# Patient Record
Sex: Female | Born: 1965
Health system: Southern US, Community
[De-identification: ages and names within clinical notes are randomized; demographics above are authoritative.]

## PROBLEM LIST (undated history)

## (undated) DIAGNOSIS — D689 Coagulation defect, unspecified: Secondary | ICD-10-CM

## (undated) HISTORY — PX: KNEE SURGERY: SHX244

## (undated) HISTORY — DX: Coagulation defect, unspecified: D68.9

## (undated) HISTORY — PX: TUBAL LIGATION: SHX77

## (undated) HISTORY — PX: CERVICAL CERCLAGE: SHX1329

---

## 1998-09-04 ENCOUNTER — Other Ambulatory Visit: Admission: RE | Admit: 1998-09-04 | Discharge: 1998-09-04 | Payer: Self-pay | Admitting: Obstetrics and Gynecology

## 1999-05-15 ENCOUNTER — Other Ambulatory Visit: Admission: RE | Admit: 1999-05-15 | Discharge: 1999-05-15 | Payer: Self-pay | Admitting: Obstetrics & Gynecology

## 1999-06-28 ENCOUNTER — Ambulatory Visit (HOSPITAL_COMMUNITY): Admission: RE | Admit: 1999-06-28 | Discharge: 1999-06-28 | Payer: Self-pay | Admitting: Obstetrics and Gynecology

## 1999-06-28 ENCOUNTER — Encounter: Payer: Self-pay | Admitting: Obstetrics and Gynecology

## 1999-07-03 ENCOUNTER — Ambulatory Visit (HOSPITAL_COMMUNITY): Admission: RE | Admit: 1999-07-03 | Discharge: 1999-07-03 | Payer: Self-pay | Admitting: Obstetrics and Gynecology

## 1999-08-09 ENCOUNTER — Ambulatory Visit (HOSPITAL_COMMUNITY): Admission: RE | Admit: 1999-08-09 | Discharge: 1999-08-09 | Payer: Self-pay | Admitting: Obstetrics and Gynecology

## 1999-08-09 ENCOUNTER — Encounter: Payer: Self-pay | Admitting: Obstetrics and Gynecology

## 1999-10-22 ENCOUNTER — Ambulatory Visit (HOSPITAL_COMMUNITY): Admission: RE | Admit: 1999-10-22 | Discharge: 1999-10-22 | Payer: Self-pay | Admitting: Obstetrics and Gynecology

## 1999-10-22 ENCOUNTER — Encounter: Payer: Self-pay | Admitting: Obstetrics and Gynecology

## 2000-01-04 ENCOUNTER — Inpatient Hospital Stay (HOSPITAL_COMMUNITY): Admission: AD | Admit: 2000-01-04 | Discharge: 2000-01-06 | Payer: Self-pay | Admitting: Obstetrics and Gynecology

## 2000-01-10 ENCOUNTER — Encounter: Admission: RE | Admit: 2000-01-10 | Discharge: 2000-04-09 | Payer: Self-pay | Admitting: Obstetrics and Gynecology

## 2002-03-26 ENCOUNTER — Other Ambulatory Visit: Admission: RE | Admit: 2002-03-26 | Discharge: 2002-03-26 | Payer: Self-pay | Admitting: Obstetrics and Gynecology

## 2003-06-22 ENCOUNTER — Other Ambulatory Visit: Admission: RE | Admit: 2003-06-22 | Discharge: 2003-06-22 | Payer: Self-pay | Admitting: Obstetrics and Gynecology

## 2003-06-27 ENCOUNTER — Ambulatory Visit (HOSPITAL_COMMUNITY): Admission: RE | Admit: 2003-06-27 | Discharge: 2003-06-27 | Payer: Self-pay | Admitting: Obstetrics and Gynecology

## 2003-11-03 ENCOUNTER — Inpatient Hospital Stay (HOSPITAL_COMMUNITY): Admission: AD | Admit: 2003-11-03 | Discharge: 2003-11-03 | Payer: Self-pay | Admitting: Obstetrics and Gynecology

## 2003-12-02 ENCOUNTER — Inpatient Hospital Stay (HOSPITAL_COMMUNITY): Admission: AD | Admit: 2003-12-02 | Discharge: 2003-12-02 | Payer: Self-pay | Admitting: Obstetrics and Gynecology

## 2003-12-16 ENCOUNTER — Inpatient Hospital Stay (HOSPITAL_COMMUNITY): Admission: AD | Admit: 2003-12-16 | Discharge: 2003-12-18 | Payer: Self-pay | Admitting: Obstetrics and Gynecology

## 2003-12-17 ENCOUNTER — Encounter (INDEPENDENT_AMBULATORY_CARE_PROVIDER_SITE_OTHER): Payer: Self-pay | Admitting: Specialist

## 2006-03-28 ENCOUNTER — Ambulatory Visit (HOSPITAL_COMMUNITY): Admission: RE | Admit: 2006-03-28 | Discharge: 2006-03-28 | Payer: Self-pay | Admitting: Obstetrics and Gynecology

## 2006-06-25 ENCOUNTER — Other Ambulatory Visit: Admission: RE | Admit: 2006-06-25 | Discharge: 2006-06-25 | Payer: Self-pay | Admitting: Gynecologic Oncology

## 2006-06-25 ENCOUNTER — Encounter (INDEPENDENT_AMBULATORY_CARE_PROVIDER_SITE_OTHER): Payer: Self-pay | Admitting: Specialist

## 2008-03-28 ENCOUNTER — Ambulatory Visit (HOSPITAL_COMMUNITY): Admission: RE | Admit: 2008-03-28 | Discharge: 2008-03-28 | Payer: Self-pay | Admitting: Obstetrics and Gynecology

## 2008-06-14 ENCOUNTER — Ambulatory Visit: Admission: RE | Admit: 2008-06-14 | Discharge: 2008-06-14 | Payer: Self-pay | Admitting: Gynecologic Oncology

## 2008-06-14 ENCOUNTER — Encounter: Payer: Self-pay | Admitting: Gynecologic Oncology

## 2009-08-03 ENCOUNTER — Ambulatory Visit: Payer: Self-pay | Admitting: Internal Medicine

## 2009-10-31 ENCOUNTER — Ambulatory Visit: Payer: Self-pay | Admitting: Internal Medicine

## 2010-01-19 ENCOUNTER — Ambulatory Visit (HOSPITAL_COMMUNITY): Admission: RE | Admit: 2010-01-19 | Discharge: 2010-01-19 | Payer: Self-pay | Admitting: Obstetrics and Gynecology

## 2010-12-25 NOTE — Consult Note (Signed)
NAME:  Lisa Gomez, Lisa Gomez          ACCOUNT NO.:  [id number]   MEDICAL RECORD NO.:  [id number]          PATIENT TYPE:  OUT   LOCATION:  GYN                          FACILITY:  [facility]   PHYSICIAN:  Paola A. Duard Brady, MD    DATE OF BIRTH:  Jan 11, 1966   DATE OF CONSULTATION:  06/14/2008  DATE OF DISCHARGE:                                 CONSULTATION   HISTORY:  The patient is a very pleasant 45 year old who comes in today  for a Pap smear.  She is overall doing quite well.  She has another  primary Lisa Gomez, however, due to scheduling issues wanted to have a Pap  smear done today.  She is otherwise without complaints.  She states she  does routine self breast exams and is up-to-date on her mammograms.   PHYSICAL EXAMINATION:  GENERAL:  Well-nourished, well-developed female  in no acute distress.  PELVIC:  External genitalia is within normal limits.  Vagina is well  epithelized.  The cervix is visualized.  It is multiparous in  appearance.  ThinPrep Pap was submitted without difficulty.  Bimanual  examination reveals no masses or nodularity.  The cervix is palpably  normal.  The corpus is of normal size, shape, consistency.  There are no  adnexal masses.   ASSESSMENT:  A 45 year old here for annual Pap smear.   PLAN:  We will follow up the results of her Pap smear and will notify  her of the reports.  If it is normal, she can return to her routine  Lisa Gomez for gynecologic care.      Paola A. Duard Brady, MD  Electronically Signed     PAG/MEDQ  D:  06/14/2008  T:  06/15/2008  Job:  [id number]   cc:   Hal Morales, M.D.  Fax: [phone]   Telford Nab, R.N.  [address]  [place], [place] [address]

## 2010-12-28 NOTE — Discharge Summary (Signed)
Lisa Gomez, Lisa Gomez                              ACCOUNT NO.:  [id number]   MEDICAL RECORD NO.:  [id number]                   PATIENT TYPE:  INP   LOCATION:  9137                                 [facility]ACILITY:  [facility]   PHYSICIAN:  Crist Fat. Rivard, M.D.              DATE OF BIRTH:  03-18-1966   DATE OF ADMISSION:  12/16/2003  DATE OF DISCHARGE:  12/18/2003                                 DISCHARGE SUMMARY   ADMITTING DIAGNOSES:  1. Intrauterine pregnancy at [redacted] weeks gestation.  2. Rupture of membranes.  3. Active labor.   PREOPERATIVE DIAGNOSES:  1. Intrauterine pregnancy at [redacted] weeks gestation.  2. Rupture of membranes.  3. Active labor.  4. Nonreassuring fetal heart rate tracing.  5. Desires sterilization.   PROCEDURE:  1. Vacuum-assisted vaginal delivery by Dr. Dierdre Forth.  2. Repair of second degree perineal laceration.  3. Postpartum bilateral tubal ligation and removal of left paratubal cyst by     Dr. Dois Davenport Rivard.   DISCHARGE DIAGNOSES:  1. Intrauterine pregnancy at [redacted] weeks gestation.  2. Rupture of membranes.  3. Active labor.  4. Nonreassuring fetal heart rate tracing.  5. Desires sterilization.  6. Vacuum-assisted vaginal delivery by Dr. Dierdre Forth.  7. Repair of second degree perineal laceration.  8. Postpartum bilateral tubal ligation and removal of left paratubal cyst by     Dr. Dois Davenport Rivard.   HISTORY:  [name] is a 45 year old, gravida 3, para 1-1-0-2, who presents  at [redacted] weeks gestation with premature rupture of membranes in active labor.   HOSPITAL COURSE:  Her labor progressed normally with variable decelerations  of fetal heart rate, and amnioinfusion was performed, and, as patient became  complete and began pushing, vacuum assistance was recommended and accepted  by patient for nonreassuring fetal heart rate tracing.  Therefore, vacuum-  assisted vaginal deliver was performed by Dr. Dierdre Forth with the birth  of a 6 pound, 12 ounce female  infant named Jerrel Ivory, with Apgar scores of 9  at 1 minute, 9 at 5 minutes.  Both patient and infant have done well in the  postpartum period.  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  On the first postpartum day, the patient underwent postpartum  bilateral tubal sterilization secondary to her desire for sterilization,  with removal of left paratubal cyst by Dr. Dois Davenport Rivard.  Her vital signs  have remained stable.  On the first postpartum day, her hemoglobin was noted  to be 8.0.  The patient has a history of anemia, and she has remained stable  with no dizziness or syncope.  The patient is stable with her abdomen soft,  fundus firm, 1 below u, with normal lochia.  Her umbilical incision is  clean, dry, and intact, and on this, her second postpartum day, she is  judged to be in satisfactory condition for discharge.  DISCHARGE INSTRUCTIONS:  Per [facility] handout.   DISCHARGE MEDICATIONS:  1. Motrin 600 mg p.o. q.6h. p.r.n. pain.  2. Tylox 1-2 p.o. q.3-4h. p.r.n. pain.  3. The patient will continue her prenatal vitamins and iron supplements.   FOLLOW UP:  She will call for any problems or concerns; otherwise, she will  be seen at the office at [facility] for her 6 weeks postpartum exam.     Rica Koyanagi, C.N.M.               Crist Fat Rivard, M.D.    SDM/MEDQ  D:  12/18/2003  T:  12/18/2003  Job:  [id number]

## 2010-12-28 NOTE — H&P (Signed)
NAME:  Lisa Gomez, Lisa Gomez                              ACCOUNT NO.:  [id number]   MEDICAL RECORD NO.:  [id number]                   PATIENT TYPE:  INP   LOCATION:  9166                                 [facility]ACILITY:  [facility]   PHYSICIAN:  Janine Limbo, M.D.            DATE OF BIRTH:  [birth year]   DATE OF ADMISSION:  12/16/2003  DATE OF DISCHARGE:                                HISTORY & PHYSICAL   HISTORY OF PRESENT ILLNESS:  Lisa Gomez is a 45 year old married black  female, gravida 3, para 1-1-0-2, at 83 weeks' gestation, who presents  complaining of leaking large amounts of clear fluid around 3:20 this  morning.  She reports mild uterine contractions but states that they are  infrequent, but also reports that she feel pressure and breathes with her  contractions when she has them.  She denies any nausea, vomiting, headaches  or visual disturbances.  Her pregnancy has been followed at [facility] by the M.D. service and has been at risk for:  #1 - Advanced maternal  age, declining amniocentesis; #2 - conception on intrauterine device; #3 - a  history of preterm delivery and incompetent cervix for which she had a  cerclage with this pregnancy.  Her group B strep is negative.  Her cerclage  was removed December 02, 2003.   OBSTETRICAL/GYNECOLOGICAL HISTORY:  She is a gravida 3, para 1-1-0-2, who  delivered a viable female infant in May of 1998 who weighed 3 pounds 14  ounces at 58 weeks' gestation following an 18-hour labor without  complication and in May of 2001, she delivered a viable female infant who  weighed 7 pounds 13 ounces at 40 weeks' gestation following a 7- to 8-hour  labor, also without complication, but she did have a cerclage with that  pregnancy.  She actually conceived this pregnancy on the IUD and had the IUD  removed in September 2004 and had a cerclage which was removed December 02, 2003.   ALLERGIES:  She has no known drug allergies.   GENERAL MEDICAL HISTORY:   She reports having had the usual childhood  diseases.  She reports a history of asthma as a child, occasional urinary  tract infections and her only surgeries were on the right knee 2 times and  the cerclage and some oral surgery.   FAMILY HISTORY:  Her family history is significant for paternal and maternal  grandfather with MI, both deceased, mom and dad with hypertension, on  medications, maternal aunt and paternal aunt with adult-onset diabetes,  father with thyroid disease, maternal grandmother and paternal grandmother  with stroke, uncle and another uncle with colon and lung cancer.   GENETIC HISTORY:  Her genetic history is only significant for the fact that  she is over age 64 at the time ___________.   SOCIAL HISTORY:  She is married to Dedra Skeens, who is involved and  supportive.  She is employed full-time as a Development worker, community in OB/GYN.  He is  employed in information systems __________.  They deny any religious  affiliation affecting their care.  They deny any illicit drug use, alcohol  or smoking with this pregnancy.   PRENATAL LABORATORY DATA:  Her blood type is B-positive, antibody screen is  negative, sickle cell trait is negative, syphilis is nonreactive, rubella is  positive, hepatitis B surface antigen is negative, GC and Chlamydia are  negative.  Her Pap is normal with the exception of some yeast and her 36-  week beta strep was negative.   PHYSICAL EXAMINATION:  VITAL SIGNS:  On physical exam, her vital signs are  stable.  She is afebrile.  HEENT:  Her HEENT is grossly within normal limits.  HEART:  Her heart is regular rate and rhythm.  CHEST:  Her chest is clear.  BREASTS:  Breasts are soft and nontender.  ABDOMEN:  Her abdomen is gravid with uterine contractions every 3-5 minutes.  Fetal heart rate is overall reactive and reassuring with some early  decelerations.  PELVIC:  Her cervix is 6 cm, 70%, vertex minus 1 and posterior with copious  clear fluid noted.   EXTREMITIES:  Her extremities are within normal limits.   ASSESSMENT:  1. Intrauterine pregnancy at term.  2. Spontaneous rupture of membranes with clear fluid at 3:20 a.m.  3. Active labor.  4. Negative group B streptococcus.   PLAN:  Her plan is to admit to labor and delivery, to follow routine M.D.  orders and Dr. Janine Limbo has been notified of the patient's  admission and will follow patient.     Concha Pyo. Duplantis, C.N.M.              Janine Limbo, M.D.    SJD/MEDQ  D:  12/16/2003  T:  12/16/2003  Job:  [id number]

## 2010-12-28 NOTE — Op Note (Signed)
   [name], Lisa Gomez                              ACCOUNT NO.:  [id number]   MEDICAL RECORD NO.:  [id number]                   PATIENT TYPE:  AMB   LOCATION:  SDC                                  [facility]   PHYSICIAN:  Hal Morales, M.D.             DATE OF BIRTH:  30-Aug-1965   DATE OF PROCEDURE:  06/27/2003  DATE OF DISCHARGE:                                 OPERATIVE REPORT   PREOPERATIVE DIAGNOSES:  1. Intrauterine pregnancy at [redacted] weeks gestation.  2. Incompetent cervix.   POSTOPERATIVE DIAGNOSES:  1. Intrauterine pregnancy at [redacted] weeks gestation.  2. Incompetent cervix.   OPERATION:  McDonald cerclage.   ANESTHESIA:  Spinal.   ESTIMATED BLOOD LOSS:  Less than 50 mL.   COMPLICATIONS:  None.   FINDINGS:  The cervix was long and closed but somewhat soft in consistency.  The uterus was approximately 14 weeks size.  The preoperative heart rate was  140 beats per minute.   PROCEDURE:  The patient was taken to the operating room after appropriate  identification and placed on the operating table.  After placement of a  spinal anesthetic, she was placed in the supine position and then in the  lithotomy position.  The perineum and vagina were prepped with multiple  layers at baseline and draped in a sterile field.  A red Robinson catheter  was used to empty the bladder.  A weighted speculum was placed in the  posterior vagina and sponge sticks placed on the anterior and posterior lips  of the cervix.  The internal os was identified, and a 5 mm Mersilene suture  used to create a pursestring at the level of the internal os, starting  posterior to the cervix and ending posterior to the cervix.  The pursestring  was then tied down and hemostasis noted to be adequate.  All instruments  were removed from the vagina, and the patient was taken from the operating  room to the recovery room in satisfactory condition, having tolerated the  procedure well with sponge and  instrument counts correct.                                               Hal Morales, M.D.    VPH/MEDQ  D:  06/27/2003  T:  06/27/2003  Job:  [id number]

## 2010-12-28 NOTE — H&P (Signed)
[facility]  Patient:    Lisa Gomez, Lisa Gomez                 MRN: [id number] Adm. Date:  [id number] Attending:  Shaune Spittle Dictator:   Vance Gather Duplantis, C.N.M.                         History and Physical  HISTORY OF PRESENT ILLNESS:       Ms. Lisa Gomez is a 45 year old, married black female, gravida 2, para 0-1-0-1, who is at [redacted] weeks gestation complaining of ruptured membranes at approximately 4 oclock this afternoon with clear fluid and the onset of uterine contractions every 10 to 15 minutes subsequently. She denies any nausea, vomiting, headache, or visual disturbances.  Her pregnancy has been followed at [facility] by the M.D. service and has been complicated by a history of preterm delivery at 31 weeks with her previous pregnancy and cerclage for cervical incompetence.  She had her cerclage removed on April 26 without complication.   OB/GYN HISTORY:                   She is a gravida 2, para 0-1-0-1, now at [redacted] weeks gestation who delivered a viable female infant in May of 1998 who weighed 3 pounds 14 ounces at [redacted] weeks gestation following a 24-hour labor period.  That pregnancy was complicated by preterm labor requiring magnesium and bed rest, and her delivery was effected after premature rupture of the membranes.  ALLERGIES:                        No known drug allergies.  PAST MEDICAL HISTORY:             She reports having had the usual childhood diseases.  She reports a history of anemia.  She reports occasional urinary tract infections and surgery on her right knee at age 44.  FAMILY HISTORY:                   Significant for both sets of grandparents with heart disease, mother with mitral valve prolapse, mother and father with chronic hypertension, father with varicose veins, maternal aunt with pulmonary embolism, paternal aunt with emphysema, maternal aunt and paternal aunt with non-insulin-dependent  diabetes mellitus, maternal grandmother and paternal grandmother with strokes.  GENETIC HISTORY:                  Negative.  SOCIAL HISTORY:                   She is married to [employer].  He is a Physicist, medical.  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  They deny illicit drug use, alcohol, or smoking with this pregnancy.  PRENATAL LABORATORY DATA:         Blood type is B positive.  Her antibody screen is negative, sickle cell trait is negative, syphilis nonreactive, toxoplasmosis negative, rubella immune, hepatitis B surface antigen negative, HIV nonreactive.  GC and chlamydia are both negative.   Pap was within normal limits.  One-hour glucola is within normal range.  Her 36-week beta Strep was negative.  PHYSICAL EXAMINATION:  VITAL SIGNS:                      Stable.  She is afebrile.  HEENT:  Grossly within normal limits.  HEART:                            Regular rhythm and rate.  CHEST:                            Clear.  BREASTS:                          Soft and nontender.  ABDOMEN:                          Gravid with uterine contractions that are mild and irregular.  Fetal heart rate is reassuring.  PELVIC:                           Exam per Dr. Dierdre Forth is 3 to 4 cm, 90% vertex, -2, and spontaneous rupture of membranes with clear fluid at approximately 4 p.m. today.  EXTREMITIES:                      Within normal limits.  ASSESSMENT:                       1. Intrauterine pregnancy at term.                                   2. Spontaneous rupture of membranes.                                   3. Early labor.                                   4. Negative group B Streptococcus.  PLAN:                             Admit to labor and delivery.  Dr. Dierdre Forth is aware of patients admission and will follow patient. DD:  01/04/00 TD:  01/04/00 Job: [id number]

## 2010-12-28 NOTE — Op Note (Signed)
Lisa Gomez, Lisa Gomez                              ACCOUNT NO.:  [id number]   MEDICAL RECORD NO.:  [id number]                   PATIENT TYPE:  INP   LOCATION:  9137                                 [facility]ACILITY:  [facility]   PHYSICIAN:  Crist Fat. Rivard, M.D.              DATE OF BIRTH:  Jan 04, 1966   DATE OF PROCEDURE:  12/17/2003  DATE OF DISCHARGE:  12/18/2003                                 OPERATIVE REPORT   PREOPERATIVE DIAGNOSIS:  Desire for sterilization.   POSTOPERATIVE DIAGNOSIS:  Desire for sterilization.   ANESTHESIA:  Epidural.   PROCEDURE:  Postpartum bilateral tubal ligation and removal of left  paratubal cyst.   SURGEON:  Dr. Estanislado Pandy.   ESTIMATED BLOOD LOSS:  Minimal.   PROCEDURE:  After being informed of the planned procedure and being aware of  the possible complications, informed consent is obtained.  Patient is taken  to OR #4, given epidural anesthesia with the preplaced epidural catheter.  She is placed in the dorsal decubitus position, prepped and draped in the  sterile fashion.  After assessing adequate level of anesthesia, we  infiltrate the umbilical area using 8 mL of Marcaine 0.25 and perform a semi-  elliptical incision which is brought down to the fascia.  The fascia is  identified, grasped with Allis forceps and incised with Mayo scissors.  The  peritoneum is then entered bluntly.  We are able to easily locate the left  tube which is grasped with Babcock forceps and exteriorized until fimbriae  is identified.  The mesosalpinx is then opened with cautery and we doubly  ligate each stump with 0 chromic, remove 1.5 cm of tube and cauterize each  stump with cautery.  Hemostasis is adequate.  We then perform removal of  three small paratubal cysts with cautery with no complication, hemostasis is  completed.  Please note, during the tying of the proximal stump patient  complained of pain which was well controlled with local irrigation with  Nesacaine 1%.  We then  proceed in the exact same manner on the right side,  each stump being doubly ligated with 0 chromic, a segment of 1.5 cm of tube  being removed, hemostasis being adequate, and we utilized the reminder of  Nesacaine 1% for postop pain which is locally released on the tube.  Fascia  is then closed with a running suture of 0 Vicryl, hemostasis of the fat is  completed with cautery and skin is closed with a subcuticular suture of 3-0  Vicryl and Steri-Strips.   Instruments and sponge count is complete x2.  Estimated blood loss is  minimal.  Procedure is well tolerated by patient who is taken to recovery  room in a well and stable condition.  Crist Fat Rivard, M.D.    SAR/MEDQ  D:  12/17/2003  T:  12/18/2003  Job:  [id number]

## 2010-12-28 NOTE — Op Note (Signed)
NAMEVALISHA, HESLIN                              ACCOUNT NO.:  [id number]   MEDICAL RECORD NO.:  [id number]                   PATIENT TYPE:  INP   LOCATION:  9137                                 [facility]ILITY[facility]   PHYSICIAN:  Hal Morales, M.D.             DATE OF BIRTH:  Oct 23, 1965   DATE OF PROCEDURE:  12/31/2003  DATE OF DISCHARGE:  12/18/2003                                 OPERATIVE REPORT   PREOPERATIVE DIAGNOSES:  1. Intrauterine pregnancy at 81 weeks' gestation.  2. Nonreassuring fetal heart rate tracing.   POSTOPERATIVE DIAGNOSES:  1. Intrauterine pregnancy at 61 weeks' gestation.  2. Nonreassuring fetal heart rate tracing.   OPERATION:  1. Vacuum-assisted vaginal delivery over intact perineum.  2. Repair of second degree perineal laceration.   ANESTHESIA:  Epidural.   ESTIMATED BLOOD LOSS:  Less than 500 mL.   COMPLICATIONS:  None.   FINDINGS:  The patient was delivered of a female infant whose name is  Jerrel Ivory, weighing 6 pounds 12 ounces, with Apgars of 9 and 9 at one and  five minutes, respectively.  There was an apparent left club foot.   PREOPERATIVE DISCUSSION:  The patient had dilated rapidly to 10 cm and with  pushing was noted to have deep variables to a nadir of 60 beats per minute.  This had been the case on several occasions during her labor.  This was  thought to be partially due to the rapidity of dilatation and descent.  The  fetal vertex was in an OA position at +3 station.  Because of the very deep  variable decelerations, a discussion was held with parents concerning the  option of vacuum-assisted vaginal delivery.  The benefit of more rapid  delivery than second-stage pushing was explained as well as the risks of  cephalohematoma, damage to maternal tissues, inability to effect a vaginal  delivery, and delivery of the fetal head with subsequent shoulder dystocia.  The parents opted to proceed with vacuum-assisted vaginal delivery.   The  patient already in the lithotomy position and the bladder had been  recently emptied.  The perineum had been prepped.  The Kiwi vacuum extractor  was then placed over the fetal vertex and with a single contraction, the  vacuum was used to deliver the fetal head over the intact perineum.  The  remainder of the infant was delivered with a combination of maternal  expulsive efforts and gentle traction.  The nares and pharynx were suctioned  and the cord was clamped and cut and the infant handed to the mother for  initial bonding.  The appropriate cord blood was drawn.  The placenta was  likewise delivered with a combination of maternal expulsive effort and  gentle traction.  A  second degree perineal laceration was then repaired with 3-0 Vicryl.  An ice  pack was placed on the perineum and the patient noted to  be stable.  The  infant was left with the mother for initial bonding with anticipated  anticipation to the full-term nursery.                                               Hal Morales, M.D.    VPH/MEDQ  D:  12/31/2003  T:  01/02/2004  Job:  [id number]

## 2012-07-02 ENCOUNTER — Other Ambulatory Visit: Payer: Self-pay | Admitting: Gynecologic Oncology

## 2012-07-02 DIAGNOSIS — Z1231 Encounter for screening mammogram for malignant neoplasm of breast: Secondary | ICD-10-CM

## 2012-07-28 ENCOUNTER — Ambulatory Visit (HOSPITAL_COMMUNITY)
Admission: RE | Admit: 2012-07-28 | Discharge: 2012-07-28 | Disposition: A | Discharge status: Home / Self Care | Payer: 59 | Source: Ambulatory Visit | Attending: Gynecologic Oncology | Admitting: Gynecologic Oncology

## 2012-07-28 DIAGNOSIS — Z1231 Encounter for screening mammogram for malignant neoplasm of breast: Secondary | ICD-10-CM | POA: Insufficient documentation

## 2013-01-05 ENCOUNTER — Other Ambulatory Visit: Payer: Self-pay | Admitting: Obstetrics

## 2013-03-16 ENCOUNTER — Other Ambulatory Visit: Payer: 59

## 2013-03-16 DIAGNOSIS — Z111 Encounter for screening for respiratory tuberculosis: Secondary | ICD-10-CM

## 2013-03-19 LAB — QUANTIFERON TB GOLD ASSAY (BLOOD)
Interferon Gamma Release Assay: NEGATIVE
Mitogen value: 9.08 IU/mL
Quantiferon Nil Value: 0.02 IU/mL
Quantiferon Tb Ag Minus Nil Value: 0 IU/mL
TB Ag value: 0.02 IU/mL

## 2013-04-30 ENCOUNTER — Other Ambulatory Visit: Payer: 59

## 2013-04-30 DIAGNOSIS — R739 Hyperglycemia, unspecified: Secondary | ICD-10-CM

## 2013-04-30 LAB — HEMOGLOBIN A1C
Hgb A1c MFr Bld: 6.1 % — ABNORMAL HIGH (ref <5.7)
Mean Plasma Glucose: 128 mg/dL — ABNORMAL HIGH (ref <117)

## 2013-05-01 LAB — CBC
HCT: 27.9 % — ABNORMAL LOW (ref 36.0–46.0)
Hemoglobin: 8.5 g/dL — ABNORMAL LOW (ref 12.0–15.0)
MCH: 21.7 pg — ABNORMAL LOW (ref 26.0–34.0)
MCHC: 30.5 g/dL (ref 30.0–36.0)
MCV: 71.2 fL — ABNORMAL LOW (ref 78.0–100.0)
Platelets: 428 10*3/uL — ABNORMAL HIGH (ref 150–400)
RBC: 3.92 MIL/uL (ref 3.87–5.11)
RDW: 19.2 % — ABNORMAL HIGH (ref 11.5–15.5)
WBC: 4.8 10*3/uL (ref 4.0–10.5)

## 2013-09-10 ENCOUNTER — Ambulatory Visit (INDEPENDENT_AMBULATORY_CARE_PROVIDER_SITE_OTHER): Payer: 59 | Admitting: Advanced Practice Midwife

## 2013-09-10 DIAGNOSIS — Z01419 Encounter for gynecological examination (general) (routine) without abnormal findings: Secondary | ICD-10-CM

## 2013-09-10 DIAGNOSIS — Z0000 Encounter for general adult medical examination without abnormal findings: Secondary | ICD-10-CM

## 2013-09-10 NOTE — Progress Notes (Signed)
Pap performed today.Tolerated well.  Nabothian Cyst x3 at 1, 3 and 6 o'clock.  Ming Kunka Wilson SingerWren CNM

## 2013-09-14 LAB — PAP IG AND HPV HIGH-RISK: HPV DNA High Risk: NOT DETECTED

## 2013-11-03 ENCOUNTER — Other Ambulatory Visit: Payer: Self-pay | Admitting: Obstetrics

## 2013-11-03 MED ORDER — DOXYCYCLINE MONOHYDRATE 25 MG/5ML PO SUSR
50.0000 mg | Freq: Two times a day (BID) | ORAL | Status: AC
Start: 2013-11-03 — End: 2013-11-13

## 2013-11-03 NOTE — Telephone Encounter (Signed)
Patient requesting rx for URI. Per Dr. Clearance CootsHarper okay to provide.

## 2013-11-05 ENCOUNTER — Other Ambulatory Visit: Payer: Self-pay | Admitting: *Deleted

## 2013-11-05 DIAGNOSIS — R251 Tremor, unspecified: Secondary | ICD-10-CM

## 2013-11-05 MED ORDER — PROPRANOLOL HCL 40 MG PO TABS: 40.0000 mg | ORAL_TABLET | Freq: Two times a day (BID) | ORAL | Status: DC

## 2013-11-10 ENCOUNTER — Other Ambulatory Visit: Payer: Self-pay | Admitting: Obstetrics and Gynecology

## 2013-11-10 DIAGNOSIS — Z1231 Encounter for screening mammogram for malignant neoplasm of breast: Secondary | ICD-10-CM

## 2013-11-15 ENCOUNTER — Ambulatory Visit (HOSPITAL_COMMUNITY)
Admission: RE | Admit: 2013-11-15 | Discharge: 2013-11-15 | Disposition: A | Discharge status: Home / Self Care | Payer: 59 | Source: Ambulatory Visit | Attending: Obstetrics and Gynecology | Admitting: Obstetrics and Gynecology

## 2013-11-15 DIAGNOSIS — Z1231 Encounter for screening mammogram for malignant neoplasm of breast: Secondary | ICD-10-CM | POA: Insufficient documentation

## 2014-03-31 ENCOUNTER — Other Ambulatory Visit: Payer: Self-pay | Admitting: *Deleted

## 2014-03-31 DIAGNOSIS — L700 Acne vulgaris: Secondary | ICD-10-CM

## 2014-03-31 MED ORDER — MINOCYCLINE HCL ER 55 MG PO TB24: 1.0000 | ORAL_TABLET | Freq: Every day | ORAL | Status: DC

## 2014-08-11 ENCOUNTER — Other Ambulatory Visit: Payer: Self-pay | Admitting: Obstetrics

## 2014-08-11 DIAGNOSIS — N390 Urinary tract infection, site not specified: Secondary | ICD-10-CM

## 2014-08-11 MED ORDER — NITROFURANTOIN MONOHYD MACRO 100 MG PO CAPS: 100.0000 mg | ORAL_CAPSULE | Freq: Two times a day (BID) | ORAL | Status: DC

## 2014-09-22 ENCOUNTER — Other Ambulatory Visit: Payer: Self-pay | Admitting: *Deleted

## 2014-09-22 DIAGNOSIS — Z20818 Contact with and (suspected) exposure to other bacterial communicable diseases: Secondary | ICD-10-CM

## 2014-09-22 MED ORDER — PENICILLIN V POTASSIUM 500 MG PO TABS: 500.0000 mg | ORAL_TABLET | Freq: Four times a day (QID) | ORAL | Status: DC

## 2014-12-27 ENCOUNTER — Other Ambulatory Visit: Payer: Self-pay | Admitting: *Deleted

## 2014-12-27 DIAGNOSIS — G4700 Insomnia, unspecified: Secondary | ICD-10-CM

## 2014-12-27 MED ORDER — HYDROXYZINE PAMOATE 50 MG PO CAPS: 50.0000 mg | ORAL_CAPSULE | Freq: Every evening | ORAL | Status: DC | PRN

## 2014-12-27 MED ORDER — PROPRANOLOL HCL 40 MG PO TABS: 40.0000 mg | ORAL_TABLET | Freq: Two times a day (BID) | ORAL | Status: AC

## 2015-07-05 ENCOUNTER — Other Ambulatory Visit: Payer: Self-pay

## 2015-07-05 DIAGNOSIS — Z1231 Encounter for screening mammogram for malignant neoplasm of breast: Secondary | ICD-10-CM

## 2015-07-24 ENCOUNTER — Ambulatory Visit
Admission: RE | Admit: 2015-07-24 | Discharge: 2015-07-24 | Disposition: A | Discharge status: Home / Self Care | Payer: BC Managed Care – PPO | Source: Ambulatory Visit

## 2015-07-24 DIAGNOSIS — Z1231 Encounter for screening mammogram for malignant neoplasm of breast: Secondary | ICD-10-CM

## 2016-08-07 ENCOUNTER — Other Ambulatory Visit: Payer: Self-pay | Admitting: Gynecologic Oncology

## 2016-08-07 ENCOUNTER — Encounter: Payer: Self-pay | Admitting: Gynecologic Oncology

## 2016-08-07 DIAGNOSIS — L0201 Cutaneous abscess of face: Secondary | ICD-10-CM

## 2016-08-07 MED ORDER — MINOCYCLINE HCL 50 MG PO TABS: 50.0000 mg | ORAL_TABLET | Freq: Two times a day (BID) | ORAL | 2 refills | Status: DC

## 2016-08-07 NOTE — Progress Notes (Signed)
Patient called reporting facial abscess from severe acne.  Has taken minocycline in the past and tolerated well.  New prescription sent in for twice daily for one month with two refills.  She is advised to call for any changes.  Reportable signs and symptoms reviewed.

## 2016-09-10 ENCOUNTER — Other Ambulatory Visit: Payer: Self-pay | Admitting: Family Medicine

## 2016-09-10 DIAGNOSIS — Z1231 Encounter for screening mammogram for malignant neoplasm of breast: Secondary | ICD-10-CM

## 2016-10-14 ENCOUNTER — Ambulatory Visit
Admission: RE | Admit: 2016-10-14 | Discharge: 2016-10-14 | Disposition: A | Discharge status: Home / Self Care | Payer: BC Managed Care – PPO | Source: Ambulatory Visit | Attending: Family Medicine | Admitting: Family Medicine

## 2016-10-14 DIAGNOSIS — Z1231 Encounter for screening mammogram for malignant neoplasm of breast: Secondary | ICD-10-CM

## 2017-09-22 ENCOUNTER — Other Ambulatory Visit: Payer: Self-pay | Admitting: Obstetrics and Gynecology

## 2017-09-22 DIAGNOSIS — Z1231 Encounter for screening mammogram for malignant neoplasm of breast: Secondary | ICD-10-CM

## 2017-11-10 ENCOUNTER — Ambulatory Visit
Admission: RE | Admit: 2017-11-10 | Discharge: 2017-11-10 | Disposition: A | Discharge status: Home / Self Care | Payer: BC Managed Care – PPO | Source: Ambulatory Visit | Attending: Obstetrics and Gynecology | Admitting: Obstetrics and Gynecology

## 2017-11-10 DIAGNOSIS — Z1231 Encounter for screening mammogram for malignant neoplasm of breast: Secondary | ICD-10-CM

## 2018-04-21 ENCOUNTER — Other Ambulatory Visit: Payer: Self-pay | Admitting: Gynecologic Oncology

## 2018-04-21 MED ORDER — CLINDAMYCIN PALMITATE HCL 75 MG/5ML PO SOLR: 150.0000 mg | Freq: Two times a day (BID) | ORAL | 0 refills | Status: DC

## 2018-04-21 NOTE — Progress Notes (Signed)
Pt presents to the office with gingivitis.  Difficulty with taking oral medications.  Liquid clindamycin sent to pharm.  She is advised to call for any needs or concerns.

## 2018-09-14 ENCOUNTER — Other Ambulatory Visit: Payer: Self-pay | Admitting: Gynecologic Oncology

## 2018-09-14 DIAGNOSIS — J111 Influenza due to unidentified influenza virus with other respiratory manifestations: Secondary | ICD-10-CM

## 2018-09-14 MED ORDER — OSELTAMIVIR PHOSPHATE 75 MG PO CAPS
75.0000 mg | ORAL_CAPSULE | Freq: Two times a day (BID) | ORAL | 0 refills | Status: DC
Start: 2018-09-14 — End: 2019-04-08

## 2018-09-14 NOTE — Progress Notes (Signed)
Patient presents with flu symptoms after being exposed multiple times to patient with positive influenza A testing.  Per ID, patient needs to begin Tamiflu immediately.

## 2018-10-26 ENCOUNTER — Other Ambulatory Visit: Payer: Self-pay | Admitting: Gynecologic Oncology

## 2018-10-26 DIAGNOSIS — R509 Fever, unspecified: Secondary | ICD-10-CM

## 2018-10-26 NOTE — Progress Notes (Signed)
Error

## 2018-10-26 NOTE — Progress Notes (Signed)
Patient has fever, dry cough, fatigue.  Exposed to sick patient (not confirmed COVID) on Friday.  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

## 2018-12-07 ENCOUNTER — Other Ambulatory Visit: Payer: Self-pay | Admitting: Gynecologic Oncology

## 2018-12-07 DIAGNOSIS — N3000 Acute cystitis without hematuria: Secondary | ICD-10-CM

## 2018-12-07 MED ORDER — SULFAMETHOXAZOLE-TRIMETHOPRIM 800-160 MG PO TABS: 1.0000 | ORAL_TABLET | Freq: Two times a day (BID) | ORAL | 0 refills | Status: DC

## 2018-12-07 MED FILL — SULFAMETHOXAZOLE-TMP DS TAB: 800-160 | 3 days supply | Qty: 6 | Fill #0

## 2018-12-07 NOTE — Progress Notes (Signed)
Patient called with complaints of dysuria, frequency, urgency for the past two days.  She has had urinary tract infections in the past and states this is similar to what she has experienced.  She has tolerated bactrim well in the past.  Plan to empirically treat. She is to call if symptoms persist.

## 2019-03-09 ENCOUNTER — Other Ambulatory Visit: Payer: Self-pay | Admitting: Gynecologic Oncology

## 2019-03-09 DIAGNOSIS — R600 Localized edema: Secondary | ICD-10-CM

## 2019-03-11 ENCOUNTER — Ambulatory Visit (HOSPITAL_COMMUNITY)
Admission: RE | Admit: 2019-03-11 | Discharge: 2019-03-11 | Disposition: A | Discharge status: Home / Self Care | Payer: BC Managed Care – PPO | Source: Ambulatory Visit | Attending: Gynecologic Oncology | Admitting: Gynecologic Oncology

## 2019-03-11 ENCOUNTER — Other Ambulatory Visit: Payer: Self-pay | Admitting: Gynecologic Oncology

## 2019-03-11 ENCOUNTER — Other Ambulatory Visit: Payer: Self-pay

## 2019-03-11 DIAGNOSIS — I82412 Acute embolism and thrombosis of left femoral vein: Secondary | ICD-10-CM

## 2019-03-11 DIAGNOSIS — R600 Localized edema: Secondary | ICD-10-CM | POA: Insufficient documentation

## 2019-03-11 LAB — CBC
HCT: 29.1 % — ABNORMAL LOW (ref 36.0–46.0)
Hemoglobin: 8.4 g/dL — ABNORMAL LOW (ref 12.0–15.0)
MCH: 23.5 pg — ABNORMAL LOW (ref 26.0–34.0)
MCHC: 28.9 g/dL — ABNORMAL LOW (ref 30.0–36.0)
MCV: 81.3 fL (ref 80.0–100.0)
Platelets: 457 10*3/uL — ABNORMAL HIGH (ref 150–400)
RBC: 3.58 MIL/uL — ABNORMAL LOW (ref 3.87–5.11)
RDW: 21.2 % — ABNORMAL HIGH (ref 11.5–15.5)
WBC: 4.6 10*3/uL (ref 4.0–10.5)
nRBC: 0 % (ref 0.0–0.2)

## 2019-03-11 LAB — COMPREHENSIVE METABOLIC PANEL
ALT: 16 U/L (ref 0–44)
AST: 17 U/L (ref 15–41)
Albumin: 4.4 g/dL (ref 3.5–5.0)
Alkaline Phosphatase: 46 U/L (ref 38–126)
Anion gap: 12 (ref 5–15)
BUN: 12 mg/dL (ref 6–20)
CO2: 23 mmol/L (ref 22–32)
Calcium: 8.9 mg/dL (ref 8.9–10.3)
Chloride: 103 mmol/L (ref 98–111)
Creatinine, Ser: 0.7 mg/dL (ref 0.44–1.00)
GFR calc Af Amer: >60 mL/min (ref >60)
GFR calc non Af Amer: >60 mL/min (ref >60)
Glucose, Bld: 97 mg/dL (ref 70–99)
Potassium: 3.8 mmol/L (ref 3.5–5.1)
Sodium: 138 mmol/L (ref 135–145)
Total Bilirubin: 0.9 mg/dL (ref 0.3–1.2)
Total Protein: 7.6 g/dL (ref 6.5–8.1)

## 2019-03-11 MED ORDER — RIVAROXABAN (XARELTO) VTE STARTER PACK (15 & 20 MG): ORAL_TABLET | ORAL | 0 refills | Status: DC

## 2019-03-11 MED FILL — XARELTO STARTER PACK: 15 & 20 | 30 days supply | Qty: 51 | Fill #0

## 2019-03-11 NOTE — Progress Notes (Signed)
Patient with newly diagnosed left DVT. No symptoms of PE. Will plan outpatient management with xarelto and referral to hematology.

## 2019-03-11 NOTE — Progress Notes (Addendum)
Left lower extremity venous duplex completed. Preliminary results pending  in Chart review [facility] [place], [place] 03/11/2019, 3:55 PM

## 2019-03-11 NOTE — Progress Notes (Signed)
Pre-treatment labs for DVT

## 2019-03-11 NOTE — Progress Notes (Signed)
Resent script for xarelto.

## 2019-04-06 ENCOUNTER — Telehealth: Payer: Self-pay

## 2019-04-06 NOTE — Telephone Encounter (Signed)
Called per Dr. Alvy Bimler and scheduled appt with for 1 pm on 8/27. She verbalized understanding.

## 2019-04-08 ENCOUNTER — Other Ambulatory Visit: Payer: Self-pay

## 2019-04-08 ENCOUNTER — Inpatient Hospital Stay: Payer: BC Managed Care – PPO | Attending: Hematology and Oncology | Admitting: Hematology and Oncology

## 2019-04-08 ENCOUNTER — Inpatient Hospital Stay: Payer: BC Managed Care – PPO

## 2019-04-08 ENCOUNTER — Encounter: Payer: Self-pay | Admitting: Hematology and Oncology

## 2019-04-08 DIAGNOSIS — I82402 Acute embolism and thrombosis of unspecified deep veins of left lower extremity: Secondary | ICD-10-CM | POA: Insufficient documentation

## 2019-04-08 DIAGNOSIS — D509 Iron deficiency anemia, unspecified: Secondary | ICD-10-CM | POA: Insufficient documentation

## 2019-04-08 DIAGNOSIS — I82492 Acute embolism and thrombosis of other specified deep vein of left lower extremity: Secondary | ICD-10-CM

## 2019-04-08 DIAGNOSIS — Z7901 Long term (current) use of anticoagulants: Secondary | ICD-10-CM

## 2019-04-08 DIAGNOSIS — D500 Iron deficiency anemia secondary to blood loss (chronic): Secondary | ICD-10-CM

## 2019-04-08 LAB — CBC WITH DIFFERENTIAL/PLATELET
Abs Immature Granulocytes: 0 10*3/uL (ref 0.00–0.07)
Basophils Absolute: 0 10*3/uL (ref 0.0–0.1)
Basophils Relative: 1 %
Eosinophils Absolute: 0.1 10*3/uL (ref 0.0–0.5)
Eosinophils Relative: 3 %
HCT: 31.5 % — ABNORMAL LOW (ref 36.0–46.0)
Hemoglobin: 9.4 g/dL — ABNORMAL LOW (ref 12.0–15.0)
Immature Granulocytes: 0 %
Lymphocytes Relative: 39 %
Lymphs Abs: 1.2 10*3/uL (ref 0.7–4.0)
MCH: 24.6 pg — ABNORMAL LOW (ref 26.0–34.0)
MCHC: 29.8 g/dL — ABNORMAL LOW (ref 30.0–36.0)
MCV: 82.5 fL (ref 80.0–100.0)
Monocytes Absolute: 0.5 10*3/uL (ref 0.1–1.0)
Monocytes Relative: 14 %
Neutro Abs: 1.3 10*3/uL — ABNORMAL LOW (ref 1.7–7.7)
Neutrophils Relative %: 43 %
Platelets: 503 10*3/uL — ABNORMAL HIGH (ref 150–400)
RBC: 3.82 MIL/uL — ABNORMAL LOW (ref 3.87–5.11)
RDW: 22.1 % — ABNORMAL HIGH (ref 11.5–15.5)
WBC: 3.1 10*3/uL — ABNORMAL LOW (ref 4.0–10.5)
nRBC: 0 % (ref 0.0–0.2)

## 2019-04-08 LAB — IRON AND TIBC
Iron: 12 ug/dL — ABNORMAL LOW (ref 41–142)
Saturation Ratios: 3 % — ABNORMAL LOW (ref 21–57)
TIBC: 451 ug/dL — ABNORMAL HIGH (ref 236–444)
UIBC: 439 ug/dL — ABNORMAL HIGH (ref 120–384)

## 2019-04-08 LAB — FERRITIN: Ferritin: 5 ng/mL — ABNORMAL LOW (ref 11–307)

## 2019-04-08 MED ORDER — RIVAROXABAN 20 MG PO TABS: 20.0000 mg | ORAL_TABLET | Freq: Every day | ORAL | 1 refills | Status: DC

## 2019-04-08 MED FILL — XARELTO 20 MG TABLET: 20 | 30 days supply | Qty: 30 | Fill #0

## 2019-04-08 NOTE — Assessment & Plan Note (Signed)
The most likely cause of her anemia is due to chronic blood loss/malabsorption syndrome. We discussed some of the risks, benefits, and alternatives of intravenous iron infusions. The patient is symptomatic from anemia and the iron level is critically low. She tolerated oral iron supplement poorly and desires to achieved higher levels of iron faster for adequate hematopoesis. Some of the side-effects to be expected including risks of infusion reactions, phlebitis, headaches, nausea and fatigue.  The patient is willing to proceed. Patient education material was dispensed.  Goal is to keep ferritin level greater than 50 and normalization of anemia I plan to recheck iron studies end of October

## 2019-04-08 NOTE — Progress Notes (Signed)
[facility] CONSULT NOTE  Patient Care Team: Helane Rima, MD as PCP - General (Family Medicine)  CHIEF COMPLAINTS/PURPOSE OF CONSULTATION:  Left lower extremity DVT, strong family history of blood clots  HISTORY OF PRESENTING ILLNESS:  Lisa Gomez 53 y.o. female is here because of recent diagnosis of LLE DVT This is her first diagnosis of blood clot  She noticed left lower extremity swelling for 1 week prior to diagnosis. She denies warmth, tenderness & erythema.  She denies recent chest pain on exertion, shortness of breath on minimal exertion, pre-syncopal episodes, hemoptysis, or palpitation. She denies recent history of trauma, long distance travel, dehydration, recent surgery, smoking but has reduced activity due to COVID restrictions. She had no prior history or diagnosis of cancer. Her age appropriate screening programs are up-to-date. She had prior surgeries before and never had perioperative thromboembolic events. The patient had been exposed to birth control pills, hormone replacement therapy and never had thrombotic events. The patient had been pregnant before and denies history of peripartum thromboembolic event or history of recurrent miscarriages. There is strong family history of blood clots   On 03/11/2019, she had venous Doppler US: Findings: Right: No evidence of common femoral vein obstruction. Left: Findings consistent with acute deep vein thrombosis involving the left posterior tibial veins.   She is also anemic and is noted to have thrombocytosis She has anemia all her life She has occasional dyspnea on exertion. Denies recent chest pain on exertion,pre-syncopal episodes, or palpitations. She had not noticed any recent bleeding such as epistaxis, hematuria or hematochezia The patient denies over the counter NSAID ingestion. She is not on antiplatelets agents. Her last colonoscopy was 3 years ago and was normal She has pica and eats a  variety of diet. She had donated blood but never received blood transfusion The patient was prescribed oral iron supplements but she tolerated that poorly   MEDICAL HISTORY:  Past Medical History:  Diagnosis Date  . Clotting disorder ([place])     SURGICAL HISTORY: Past Surgical History:  Procedure Laterality Date  . CERVICAL CERCLAGE    . KNEE SURGERY Left   . TUBAL LIGATION Bilateral     SOCIAL HISTORY: Social History   Socioeconomic History  . Marital status: Married    Spouse name: Not on file  . Number of children: 3  . Years of education: Not on file  . Highest education level: Not on file  Occupational History  . Occupation: GYN  Social Needs  . Financial resource strain: Not on file  . Food insecurity    Worry: Not on file    Inability: Not on file  . Transportation needs    Medical: Not on file    Non-medical: Not on file  Tobacco Use  . Smoking status: Never Smoker  . Smokeless tobacco: Never Used  Substance and Sexual Activity  . Alcohol use: Yes    Alcohol/week: 4.0 standard drinks    Types: 4 Glasses of wine per week  . Drug use: Never  . Sexual activity: Not on file  Lifestyle  . Physical activity    Days per week: Not on file    Minutes per session: Not on file  . Stress: Not on file  Relationships  . Social Herbalist on phone: Not on file    Gets together: Not on file    Attends religious service: Not on file    Active member of club or organization: Not on  file    Attends meetings of clubs or organizations: Not on file    Relationship status: Not on file  . Intimate partner violence    Fear of current or ex partner: Not on file    Emotionally abused: Not on file    Physically abused: Not on file    Forced sexual activity: Not on file  Other Topics Concern  . Not on file  Social History Narrative  . Not on file    FAMILY HISTORY: Family History  Problem Relation Age of Onset  . Clotting disorder Mother   . Cancer Father         multiple myeloma  . Clotting disorder Maternal Aunt        DVT  . Clotting disorder Maternal Uncle        DVT  . Clotting disorder Maternal Grandfather        DVT  . Breast cancer Neg Hx     ALLERGIES:  has no allergies on file.  MEDICATIONS:  Current Outpatient Medications  Medication Sig Dispense Refill  . hydrOXYzine (VISTARIL) 50 MG capsule Take 1 capsule (50 mg total) by mouth at bedtime as needed. 60 capsule 1  . propranolol (INDERAL) 40 MG tablet Take 1 tablet (40 mg total) by mouth 2 (two) times daily. 60 tablet 1  . rivaroxaban (XARELTO) 20 MG TABS tablet Take 1 tablet (20 mg total) by mouth daily with supper. 30 tablet 1   No current facility-administered medications for this visit.     REVIEW OF SYSTEMS:   Constitutional: Denies fevers, chills or abnormal night sweats Eyes: Denies blurriness of vision, double vision or watery eyes Ears, nose, mouth, throat, and face: Denies mucositis or sore throat Respiratory: Denies cough, dyspnea or wheezes Cardiovascular: Denies palpitation, chest discomfort  Gastrointestinal:  Denies nausea, heartburn or change in bowel habits Skin: Denies abnormal skin rashes Lymphatics: Denies new lymphadenopathy or easy bruising Neurological:Denies numbness, tingling or new weaknesses Behavioral/Psych: Mood is stable, no new changes  All other systems were reviewed with the patient and are negative.  PHYSICAL EXAMINATION: ECOG PERFORMANCE STATUS: 1 - Symptomatic but completely ambulatory  Vitals:   04/08/19 1306  BP: (!) 144/78  Pulse: 98  Resp: 20  Temp: 99.1 F (37.3 C)  SpO2: 100%   Filed Weights   04/08/19 1306  Weight: 138 lb (62.6 kg)    GENERAL:alert, no distress and comfortable HEART: she has minimal left lower extremity edema NEURO: no focal motor/sensory deficits  LABORATORY DATA:  I have reviewed the data as listed Lab Results  Component Value Date   WBC 4.6 03/11/2019   HGB 8.4 (L) 03/11/2019   HCT  29.1 (L) 03/11/2019   MCV 81.3 03/11/2019   PLT 457 (H) 03/11/2019    RADIOGRAPHIC STUDIES: I have personally reviewed the radiological images as listed and agreed with the findings in the report. Vas [place] Lower Extremity Venous (dvt)  Result Date: 03/12/2019  Lower Venous Study Indications: Swelling, and Pain.  Comparison Study: No prior study Performing Technologist: Toma Copier RVS  Examination Guidelines: A complete evaluation includes B-mode imaging, spectral Doppler, color Doppler, and power Doppler as needed of all accessible portions of each vessel. Bilateral testing is considered an integral part of a complete examination. Limited examinations for reoccurring indications may be performed as noted.  +-----+---------------+---------+-----------+----------+--------------+ RIGHTCompressibilityPhasicitySpontaneityPropertiesSummary        +-----+---------------+---------+-----------+----------+--------------+ CFV  Full           No  Yes                  pulsatile flow +-----+---------------+---------+-----------+----------+--------------+   +---------+---------------+---------+-----------+----------+-------+ LEFT     CompressibilityPhasicitySpontaneityPropertiesSummary +---------+---------------+---------+-----------+----------+-------+ CFV      Full           Yes      Yes                          +---------+---------------+---------+-----------+----------+-------+ SFJ      Full                                                 +---------+---------------+---------+-----------+----------+-------+ FV Prox  Full                                                 +---------+---------------+---------+-----------+----------+-------+ FV Mid   Full                                                 +---------+---------------+---------+-----------+----------+-------+ FV DistalFull                                                  +---------+---------------+---------+-----------+----------+-------+ PFV      Full                                                 +---------+---------------+---------+-----------+----------+-------+ POP      Full           Yes      Yes                          +---------+---------------+---------+-----------+----------+-------+ PTV      None                                         Acute   +---------+---------------+---------+-----------+----------+-------+     Summary: Right: No evidence of common femoral vein obstruction. Left: Findings consistent with acute deep vein thrombosis involving the left posterior tibial veins.  *See table(s) above for measurements and observations. Electronically signed by Curt Jews MD on 03/12/2019 at 1:53:33 PM.    Final     ASSESSMENT & PLAN:  #1 Left DVT/PE  This last episode of blood clot appeared to be provoked. We discussed about the pros and cons about testing for thrombophilia disorder. her current anticoagulation therapy will interfere with some the tests and it is not possible to interpret the test results.  Taking her off the anticoagulation therapy to do the tests may precipitate another thrombotic event. I do not see a reason to order excessive testing to screen for thrombophilia disorder as it would not change our management.  The goal of anticoagulation  therapy is for 3 months  We discussed about various options of anticoagulation therapies including warfarin, low molecular weight heparin such as Lovenox or newer agents such as Rivaroxaban. Some of the risks and benefits discussed including costs involved, the need for monitoring, risks of life-threatening bleeding/hospitalization, reversibility of each agent in the event of bleeding or overdose, safety profile of each drug and taking into account other social issues such as ease of administration of medications, etc. Ultimately, we have made an informed decision for the patient to continue  her treatment with Xarelto I refilled her prescription today We discussed approach to reduce risk of DVT in the future and she will consider taking aspirin after completion of Xarelto  Finally, at the end of our consultation today, I reinforced the importance of preventive strategies such as avoiding hormonal supplement, avoiding cigarette smoking, keeping up-to-date with screening programs for early cancer detection, frequent ambulation for long distance travel and aggressive DVT prophylaxis in all surgical settings.  I have not made a return appointment for the patient to come back. I would be happy to assist in perioperative DVT management in the future should she need any interruption of her anticoagulation therapy for elective procedures.  #2 Iron deficiency anemia The most likely cause of her anemia is due to chronic blood loss/malabsorption syndrome. We discussed some of the risks, benefits, and alternatives of intravenous iron infusions. The patient is symptomatic from anemia and the iron level is critically low. She tolerated oral iron supplement poorly and desires to achieved higher levels of iron faster for adequate hematopoesis. Some of the side-effects to be expected including risks of infusion reactions, phlebitis, headaches, nausea and fatigue.  The patient is willing to proceed. Patient education material was dispensed.  Goal is to keep ferritin level greater than 50 and resolution of anemia I plan to recheck in October after completion of IV iron   Orders Placed This Encounter  Procedures  . CBC with Differential/Platelet    Standing Status:   Standing    Number of Occurrences:   3    Standing Expiration Date:   04/07/2020  . Ferritin    Standing Status:   Standing    Number of Occurrences:   3    Standing Expiration Date:   04/07/2020  . Iron and TIBC    Standing Status:   Standing    Number of Occurrences:   3    Standing Expiration Date:   04/07/2020    All questions  were answered. The patient knows to call the clinic with any problems, questions or concerns. I spent 40 minutes counseling the patient face to face. The total time spent in the appointment was 45 minutes and more than 50% was on counseling.     Heath Lark, MD 04/08/2019 2:05 PM

## 2019-04-09 ENCOUNTER — Telehealth: Payer: Self-pay | Admitting: Hematology and Oncology

## 2019-04-09 NOTE — Telephone Encounter (Signed)
I left a message regarding schedule  

## 2019-04-15 ENCOUNTER — Other Ambulatory Visit: Payer: Self-pay | Admitting: Hematology and Oncology

## 2019-04-15 DIAGNOSIS — D539 Nutritional anemia, unspecified: Secondary | ICD-10-CM

## 2019-04-28 ENCOUNTER — Inpatient Hospital Stay: Payer: BC Managed Care – PPO | Attending: Hematology and Oncology

## 2019-04-28 ENCOUNTER — Other Ambulatory Visit: Payer: Self-pay

## 2019-04-28 VITALS — BP 142/94 | HR 61 | Temp 98.2°F | Resp 20

## 2019-04-28 DIAGNOSIS — D509 Iron deficiency anemia, unspecified: Secondary | ICD-10-CM | POA: Insufficient documentation

## 2019-04-28 DIAGNOSIS — Z23 Encounter for immunization: Secondary | ICD-10-CM | POA: Diagnosis not present

## 2019-04-28 DIAGNOSIS — D539 Nutritional anemia, unspecified: Secondary | ICD-10-CM

## 2019-04-28 DIAGNOSIS — I82492 Acute embolism and thrombosis of other specified deep vein of left lower extremity: Secondary | ICD-10-CM

## 2019-04-28 MED ORDER — INFLUENZA VAC SPLIT QUAD 0.5 ML IM SUSY
PREFILLED_SYRINGE | INTRAMUSCULAR | Status: AC
  Filled 2019-04-28: qty 0.5

## 2019-04-28 MED ORDER — SODIUM CHLORIDE 0.9 % IV SOLN
510.0000 mg | Freq: Once | INTRAVENOUS | Status: AC
  Administered 2019-04-28: 510 mg via INTRAVENOUS
  Filled 2019-04-28: qty 17

## 2019-04-28 MED ORDER — INFLUENZA VAC SPLIT QUAD 0.5 ML IM SUSY
0.5000 mL | PREFILLED_SYRINGE | Freq: Once | INTRAMUSCULAR | Status: AC
  Administered 2019-04-28: 0.5 mL via INTRAMUSCULAR

## 2019-04-28 MED ORDER — ACETAMINOPHEN 325 MG PO TABS
ORAL_TABLET | ORAL | Status: AC
  Filled 2019-04-28: qty 2

## 2019-04-28 MED ORDER — ACETAMINOPHEN 325 MG PO TABS
650.0000 mg | ORAL_TABLET | Freq: Once | ORAL | Status: AC
  Administered 2019-04-28: 650 mg via ORAL

## 2019-04-28 MED ORDER — SODIUM CHLORIDE 0.9 % IV SOLN
Freq: Once | INTRAVENOUS | Status: AC
  Administered 2019-04-28: 10:00:00 via INTRAVENOUS
  Filled 2019-04-28: qty 250

## 2019-04-28 NOTE — Patient Instructions (Signed)

## 2019-05-04 ENCOUNTER — Telehealth: Payer: Self-pay | Admitting: Hematology and Oncology

## 2019-05-04 NOTE — Telephone Encounter (Signed)
Returned patient's phone call regarding rescheduling 09/23 appointment, left a voicemail. °

## 2019-05-05 ENCOUNTER — Inpatient Hospital Stay: Payer: BC Managed Care – PPO

## 2019-05-07 ENCOUNTER — Other Ambulatory Visit: Payer: Self-pay | Admitting: Gynecologic Oncology

## 2019-05-07 DIAGNOSIS — G4700 Insomnia, unspecified: Secondary | ICD-10-CM

## 2019-05-07 MED ORDER — HYDROXYZINE PAMOATE 25 MG PO CAPS: 25.0000 mg | ORAL_CAPSULE | Freq: Every evening | ORAL | 3 refills | Status: DC | PRN

## 2019-05-07 MED FILL — HYDROXYZINE PAMOATE 25 MG C: 25 | 90 days supply | Qty: 90 | Fill #0

## 2019-05-07 NOTE — Progress Notes (Signed)
Refill sent in for vistaril for insomnia

## 2019-05-08 MED FILL — XARELTO 20 MG TABLET: 20 | 30 days supply | Qty: 30 | Fill #1

## 2019-05-13 ENCOUNTER — Other Ambulatory Visit: Payer: Self-pay | Admitting: Family Medicine

## 2019-05-13 DIAGNOSIS — Z1231 Encounter for screening mammogram for malignant neoplasm of breast: Secondary | ICD-10-CM

## 2019-05-20 ENCOUNTER — Ambulatory Visit
Admission: RE | Admit: 2019-05-20 | Discharge: 2019-05-20 | Disposition: A | Discharge status: Home / Self Care | Payer: BC Managed Care – PPO | Source: Ambulatory Visit | Attending: Family Medicine | Admitting: Family Medicine

## 2019-05-20 ENCOUNTER — Other Ambulatory Visit: Payer: Self-pay

## 2019-05-20 DIAGNOSIS — Z1231 Encounter for screening mammogram for malignant neoplasm of breast: Secondary | ICD-10-CM

## 2019-06-07 ENCOUNTER — Telehealth: Payer: Self-pay | Admitting: Hematology and Oncology

## 2019-06-07 NOTE — Telephone Encounter (Signed)
Returned patient's phone call regarding rescheduling an appointment, left a voicemail. 

## 2019-06-09 ENCOUNTER — Other Ambulatory Visit: Payer: BC Managed Care – PPO

## 2019-06-10 ENCOUNTER — Other Ambulatory Visit: Payer: Self-pay

## 2019-06-10 ENCOUNTER — Inpatient Hospital Stay: Payer: BC Managed Care – PPO | Attending: Hematology and Oncology

## 2019-06-10 DIAGNOSIS — D509 Iron deficiency anemia, unspecified: Secondary | ICD-10-CM | POA: Diagnosis present

## 2019-06-10 DIAGNOSIS — D539 Nutritional anemia, unspecified: Secondary | ICD-10-CM

## 2019-06-10 DIAGNOSIS — I82492 Acute embolism and thrombosis of other specified deep vein of left lower extremity: Secondary | ICD-10-CM

## 2019-06-10 DIAGNOSIS — D500 Iron deficiency anemia secondary to blood loss (chronic): Secondary | ICD-10-CM

## 2019-06-10 LAB — CBC WITH DIFFERENTIAL/PLATELET
Abs Immature Granulocytes: 0.01 10*3/uL (ref 0.00–0.07)
Basophils Absolute: 0 10*3/uL (ref 0.0–0.1)
Basophils Relative: 1 %
Eosinophils Absolute: 0.1 10*3/uL (ref 0.0–0.5)
Eosinophils Relative: 3 %
HCT: 40.9 % (ref 36.0–46.0)
Hemoglobin: 13.3 g/dL (ref 12.0–15.0)
Immature Granulocytes: 0 %
Lymphocytes Relative: 39 %
Lymphs Abs: 1.5 10*3/uL (ref 0.7–4.0)
MCH: 28.9 pg (ref 26.0–34.0)
MCHC: 32.5 g/dL (ref 30.0–36.0)
MCV: 88.9 fL (ref 80.0–100.0)
Monocytes Absolute: 0.3 10*3/uL (ref 0.1–1.0)
Monocytes Relative: 9 %
Neutro Abs: 1.8 10*3/uL (ref 1.7–7.7)
Neutrophils Relative %: 48 %
Platelets: 331 10*3/uL (ref 150–400)
RBC: 4.6 MIL/uL (ref 3.87–5.11)
RDW: 15.6 % — ABNORMAL HIGH (ref 11.5–15.5)
WBC: 3.7 10*3/uL — ABNORMAL LOW (ref 4.0–10.5)
nRBC: 0 % (ref 0.0–0.2)

## 2019-06-10 LAB — IRON AND TIBC
Iron: 61 ug/dL (ref 41–142)
Saturation Ratios: 18 % — ABNORMAL LOW (ref 21–57)
TIBC: 333 ug/dL (ref 236–444)
UIBC: 271 ug/dL (ref 120–384)

## 2019-06-10 LAB — VITAMIN B12: Vitamin B-12: 578 pg/mL (ref 180–914)

## 2019-06-10 LAB — FERRITIN: Ferritin: 27 ng/mL (ref 11–307)

## 2019-06-11 ENCOUNTER — Telehealth: Payer: Self-pay | Admitting: Hematology and Oncology

## 2019-06-11 NOTE — Telephone Encounter (Signed)
I reviewed her test results over the telephone Her anemia has resolved Thrombocytosis-resolved Iron studies are borderline adequate.  She will continue oral iron supplement every other day indefinitely I plan to recheck blood work again in 6 months She has completed about 3 months of anticoagulation therapy for DVT She should be able to stop Xarelto after current prescription has run out

## 2019-06-11 NOTE — Telephone Encounter (Signed)
Scheduled appt per 10/30 sch message - pt aware and reminder letter mailed.

## 2019-07-14 ENCOUNTER — Other Ambulatory Visit: Payer: Self-pay | Admitting: Gynecologic Oncology

## 2019-07-14 DIAGNOSIS — L709 Acne, unspecified: Secondary | ICD-10-CM

## 2019-07-14 MED ORDER — MINOCYCLINE HCL 100 MG PO TABS: 100.0000 mg | ORAL_TABLET | Freq: Every day | ORAL | 0 refills | Status: DC

## 2019-07-14 MED FILL — MINOCYCLINE HCL 100 MG CAPS: 100 | 90 days supply | Qty: 90 | Fill #0

## 2019-07-14 NOTE — Progress Notes (Signed)
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  Refill sent in for minocycline. She has taken in the past and tolerated well.

## 2019-09-08 MED FILL — BENAZEPRIL-HYDROCHLOROTHIAZ: 20-12.5 | 90 days supply | Qty: 90 | Fill #0

## 2019-10-12 MED FILL — HYDROXYZINE PAMOATE 25 MG C: 25 | 90 days supply | Qty: 90 | Fill #1

## 2019-12-08 ENCOUNTER — Inpatient Hospital Stay: Payer: BC Managed Care – PPO

## 2020-01-12 MED FILL — HYDROXYZINE PAMOATE 25 MG C: 25 | 90 days supply | Qty: 90 | Fill #2

## 2020-03-21 MED FILL — BENAZEPRIL-HYDROCHLOROTHIAZ: 20-12.5 | 90 days supply | Qty: 90 | Fill #1

## 2020-04-05 ENCOUNTER — Telehealth: Payer: Self-pay | Admitting: Gynecologic Oncology

## 2020-04-05 ENCOUNTER — Other Ambulatory Visit: Payer: Self-pay | Admitting: Gynecologic Oncology

## 2020-04-05 DIAGNOSIS — F419 Anxiety disorder, unspecified: Secondary | ICD-10-CM

## 2020-04-05 MED ORDER — ESCITALOPRAM OXALATE 10 MG PO TABS: 10.0000 mg | ORAL_TABLET | Freq: Every day | ORAL | 3 refills | Status: DC

## 2020-04-05 MED FILL — ESCITALOPRAM 10 MG TABLET: 10 | 90 days supply | Qty: 90 | Fill #0

## 2020-04-05 NOTE — Telephone Encounter (Signed)
Patient called the office asking for a new prescription for lexapro. She has taken this in the past and tolerated well. She has been experiencing generalized and situational anxiety.  No suicidal ideations reported. Since patient has taken in the past and tolerated well, new prescription sent in and patient to call for any new symptoms, changes to mood, any needs.

## 2020-05-17 ENCOUNTER — Other Ambulatory Visit: Payer: Self-pay | Admitting: Gynecologic Oncology

## 2020-05-17 DIAGNOSIS — G4700 Insomnia, unspecified: Secondary | ICD-10-CM

## 2020-05-18 ENCOUNTER — Other Ambulatory Visit: Payer: Self-pay | Admitting: Gynecologic Oncology

## 2020-05-18 DIAGNOSIS — N951 Menopausal and female climacteric states: Secondary | ICD-10-CM

## 2020-05-18 MED ORDER — ESTRADIOL 0.1 MG/GM VA CREA: 1.0000 | TOPICAL_CREAM | VAGINAL | 12 refills | Status: DC

## 2020-05-18 MED FILL — HYDROXYZINE PAMOATE 25 MG C: 25 | 90 days supply | Qty: 90 | Fill #0

## 2020-05-18 MED FILL — ESTRADIOL 0.1 MG/GM CREA: 0.1 | 90 days supply | Qty: 43 | Fill #0

## 2020-09-06 MED FILL — ESCITALOPRAM 10 MG TABLET: 10 | 90 days supply | Qty: 90 | Fill #1

## 2020-10-05 MED FILL — HYDROXYZINE PAMOATE 25 MG C: 25 | 90 days supply | Qty: 90 | Fill #1

## 2020-10-17 ENCOUNTER — Other Ambulatory Visit (HOSPITAL_COMMUNITY): Payer: Self-pay | Admitting: Internal Medicine

## 2020-10-17 MED FILL — BENAZEPRIL-HYDROCHLOROTHIAZ: 20-12.5 | 90 days supply | Qty: 90 | Fill #0

## 2020-12-21 ENCOUNTER — Other Ambulatory Visit (HOSPITAL_COMMUNITY): Payer: Self-pay

## 2020-12-21 ENCOUNTER — Other Ambulatory Visit: Payer: Self-pay | Admitting: Gynecologic Oncology

## 2020-12-21 DIAGNOSIS — T7029XA Other effects of high altitude, initial encounter: Secondary | ICD-10-CM

## 2020-12-21 DIAGNOSIS — D751 Secondary polycythemia: Secondary | ICD-10-CM

## 2020-12-21 MED ORDER — ACETAZOLAMIDE 125 MG PO TABS
125.0000 mg | ORAL_TABLET | Freq: Two times a day (BID) | ORAL | 0 refills | Status: AC
  Filled 2020-12-21 – 2021-01-02 (×2): qty 6, 3d supply, fill #0

## 2020-12-21 NOTE — Progress Notes (Signed)
Patient presents to the office without an appointment for medication to treat "acute mountain sickness" from being in high altitude environments. She has taken this medication before and has tolerated well.

## 2020-12-29 ENCOUNTER — Other Ambulatory Visit (HOSPITAL_COMMUNITY): Payer: Self-pay

## 2021-01-02 ENCOUNTER — Other Ambulatory Visit (HOSPITAL_COMMUNITY): Payer: Self-pay

## 2021-01-02 MED ORDER — CARESTART COVID-19 HOME TEST VI KIT
PACK | 0 refills | Status: AC
  Filled 2021-01-02: qty 4, 4d supply, fill #0

## 2021-01-04 ENCOUNTER — Other Ambulatory Visit: Payer: Self-pay | Admitting: Internal Medicine

## 2021-01-04 DIAGNOSIS — Z1231 Encounter for screening mammogram for malignant neoplasm of breast: Secondary | ICD-10-CM

## 2021-01-11 ENCOUNTER — Encounter: Payer: Self-pay | Admitting: Hematology and Oncology

## 2021-01-13 ENCOUNTER — Ambulatory Visit: Payer: BC Managed Care – PPO

## 2021-01-16 ENCOUNTER — Encounter: Payer: Self-pay | Admitting: Hematology and Oncology

## 2021-01-17 ENCOUNTER — Encounter: Payer: Self-pay | Admitting: Hematology and Oncology

## 2021-01-17 ENCOUNTER — Other Ambulatory Visit: Payer: Self-pay

## 2021-01-17 ENCOUNTER — Ambulatory Visit
Admission: RE | Admit: 2021-01-17 | Discharge: 2021-01-17 | Disposition: A | Discharge status: Home / Self Care | Payer: BC Managed Care – PPO | Source: Ambulatory Visit | Attending: Internal Medicine | Admitting: Internal Medicine

## 2021-01-17 DIAGNOSIS — Z1231 Encounter for screening mammogram for malignant neoplasm of breast: Secondary | ICD-10-CM

## 2021-03-07 ENCOUNTER — Ambulatory Visit: Payer: BC Managed Care – PPO

## 2021-04-12 ENCOUNTER — Other Ambulatory Visit (HOSPITAL_COMMUNITY): Payer: Self-pay

## 2021-04-12 ENCOUNTER — Other Ambulatory Visit: Payer: Self-pay | Admitting: Gynecologic Oncology

## 2021-04-12 DIAGNOSIS — F419 Anxiety disorder, unspecified: Secondary | ICD-10-CM

## 2021-04-12 MED ORDER — ESCITALOPRAM OXALATE 10 MG PO TABS
ORAL_TABLET | Freq: Every day | ORAL | 3 refills | Status: DC
  Filled 2021-04-12: qty 90, 90d supply, fill #0
  Filled 2021-10-03: qty 90, 90d supply, fill #1

## 2021-05-22 ENCOUNTER — Other Ambulatory Visit: Payer: Self-pay | Admitting: Gynecologic Oncology

## 2021-05-22 ENCOUNTER — Other Ambulatory Visit (HOSPITAL_COMMUNITY): Payer: Self-pay

## 2021-05-22 DIAGNOSIS — N951 Menopausal and female climacteric states: Secondary | ICD-10-CM

## 2021-05-22 MED ORDER — ESTRADIOL 0.1 MG/GM VA CREA
1.0000 | TOPICAL_CREAM | VAGINAL | 12 refills | Status: DC
  Filled 2021-05-22: qty 42.5, 30d supply, fill #0
  Filled 2021-06-07: qty 42.5, 25d supply, fill #0

## 2021-05-22 NOTE — Progress Notes (Signed)
Patient called requesting refill on estrace vaginal cream for menopausal vaginal dryness.

## 2021-05-30 ENCOUNTER — Other Ambulatory Visit (HOSPITAL_COMMUNITY): Payer: Self-pay

## 2021-06-07 ENCOUNTER — Other Ambulatory Visit (HOSPITAL_COMMUNITY): Payer: Self-pay

## 2021-08-02 ENCOUNTER — Other Ambulatory Visit: Payer: Self-pay | Admitting: Gynecologic Oncology

## 2021-08-02 ENCOUNTER — Other Ambulatory Visit (HOSPITAL_COMMUNITY): Payer: Self-pay

## 2021-08-02 DIAGNOSIS — I159 Secondary hypertension, unspecified: Secondary | ICD-10-CM

## 2021-08-02 MED ORDER — LISINOPRIL-HYDROCHLOROTHIAZIDE 10-12.5 MG PO TABS
1.0000 | ORAL_TABLET | Freq: Every day | ORAL | 3 refills | Status: DC
  Filled 2021-08-02: qty 90, 90d supply, fill #0

## 2021-08-02 NOTE — Progress Notes (Signed)
Patient called and requested refill on HTN medications. She checks her BP on a regular basis.

## 2021-10-03 ENCOUNTER — Other Ambulatory Visit (HOSPITAL_COMMUNITY): Payer: Self-pay

## 2021-11-07 ENCOUNTER — Other Ambulatory Visit (HOSPITAL_COMMUNITY): Payer: Self-pay

## 2021-11-07 ENCOUNTER — Other Ambulatory Visit: Payer: Self-pay | Admitting: Gynecologic Oncology

## 2021-11-07 DIAGNOSIS — G8918 Other acute postprocedural pain: Secondary | ICD-10-CM

## 2021-11-07 MED ORDER — DIAZEPAM 5 MG PO TABS
5.0000 mg | ORAL_TABLET | Freq: Once | ORAL | 0 refills | Status: AC
  Filled 2021-11-07: qty 1, 1d supply, fill #0

## 2021-11-07 NOTE — Progress Notes (Signed)
Patient called and is having pain and anxiety related to pelvic examination. Plan for one time dose prior to pelvic examination. Instructed on use. Advised to not take and drive. Aware of instructions. ?

## 2021-11-30 ENCOUNTER — Other Ambulatory Visit (HOSPITAL_COMMUNITY): Payer: Self-pay

## 2021-11-30 MED ORDER — ESCITALOPRAM OXALATE 10 MG PO TABS
ORAL_TABLET | ORAL | 3 refills | Status: DC
  Filled 2021-11-30: qty 90, 90d supply, fill #0

## 2021-11-30 MED ORDER — PROPRANOLOL HCL 40 MG PO TABS
ORAL_TABLET | ORAL | 3 refills | Status: AC
  Filled 2021-11-30: qty 90, 45d supply, fill #0

## 2021-12-10 ENCOUNTER — Other Ambulatory Visit (HOSPITAL_COMMUNITY): Payer: Self-pay

## 2022-01-02 ENCOUNTER — Other Ambulatory Visit: Payer: Self-pay | Admitting: Gynecologic Oncology

## 2022-01-02 ENCOUNTER — Other Ambulatory Visit (HOSPITAL_COMMUNITY): Payer: Self-pay

## 2022-01-02 DIAGNOSIS — G4700 Insomnia, unspecified: Secondary | ICD-10-CM

## 2022-01-03 ENCOUNTER — Other Ambulatory Visit (HOSPITAL_COMMUNITY): Payer: Self-pay

## 2022-01-03 MED ORDER — HYDROXYZINE PAMOATE 25 MG PO CAPS
ORAL_CAPSULE | ORAL | 3 refills | Status: AC
  Filled 2022-01-03: qty 90, 90d supply, fill #0

## 2022-01-11 ENCOUNTER — Other Ambulatory Visit (HOSPITAL_COMMUNITY): Payer: Self-pay

## 2022-01-29 IMAGING — MG MM DIGITAL SCREENING BILAT W/ TOMO AND CAD
8 series · 8 of 24 positions shown · non-contrast
Comparison: Previous exam(s).

CLINICAL DATA: Screening.

EXAM:
DIGITAL SCREENING BILATERAL MAMMOGRAM WITH TOMOSYNTHESIS AND CAD
TECHNIQUE: Bilateral screening digital craniocaudal and mediolateral oblique
mammograms were obtained. Bilateral screening digital breast
tomosynthesis was performed. The images were evaluated with
computer-aided detection.

[L MLO synth-2D]
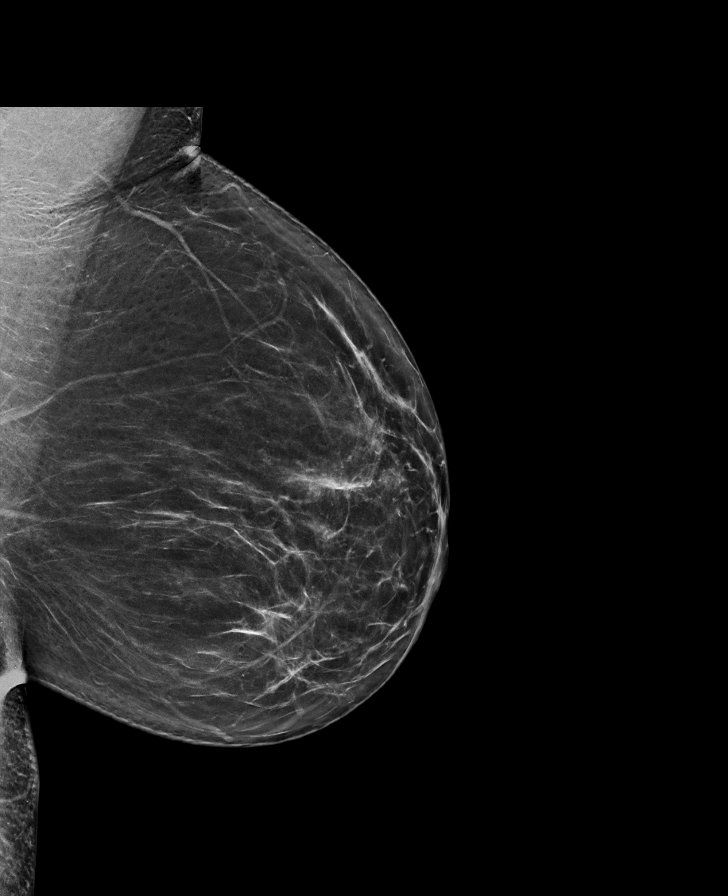

[R CC synth-2D]
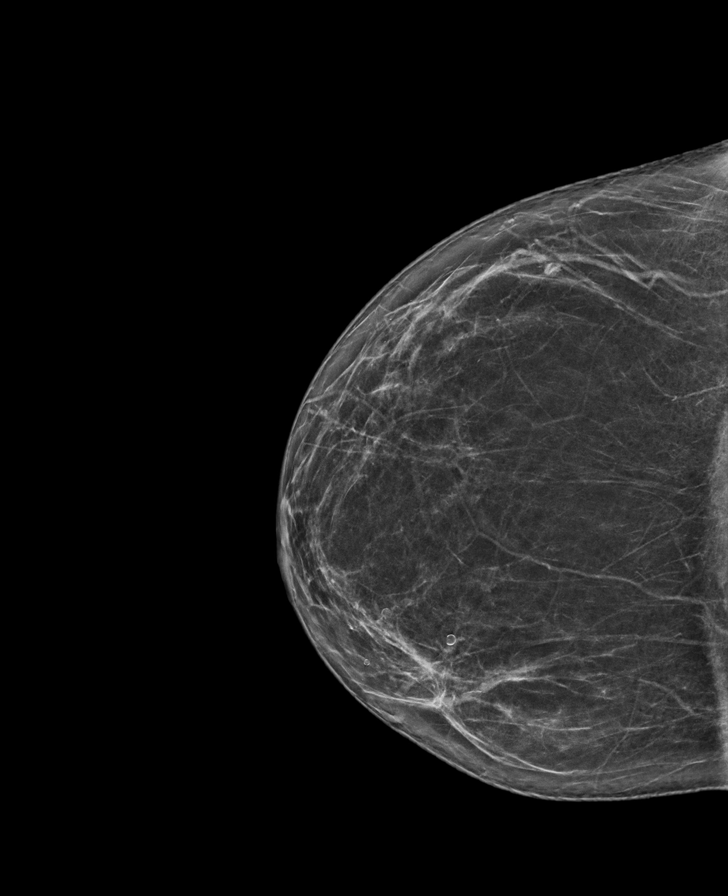

[R MLO synth-2D]
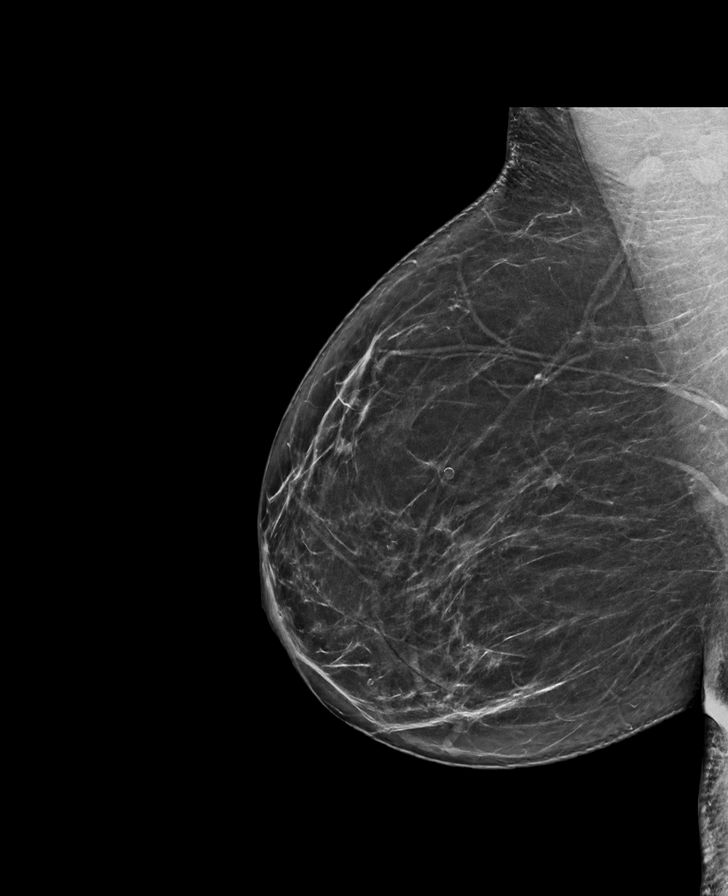

[L CC synth-2D]
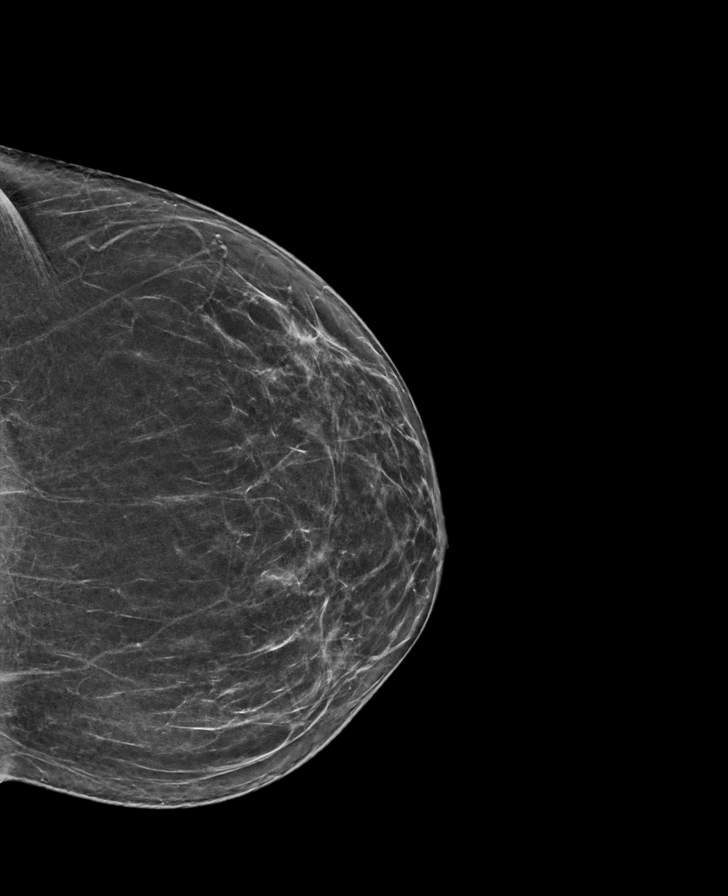

[L MLO tomo · tomo slice 40/79.0]
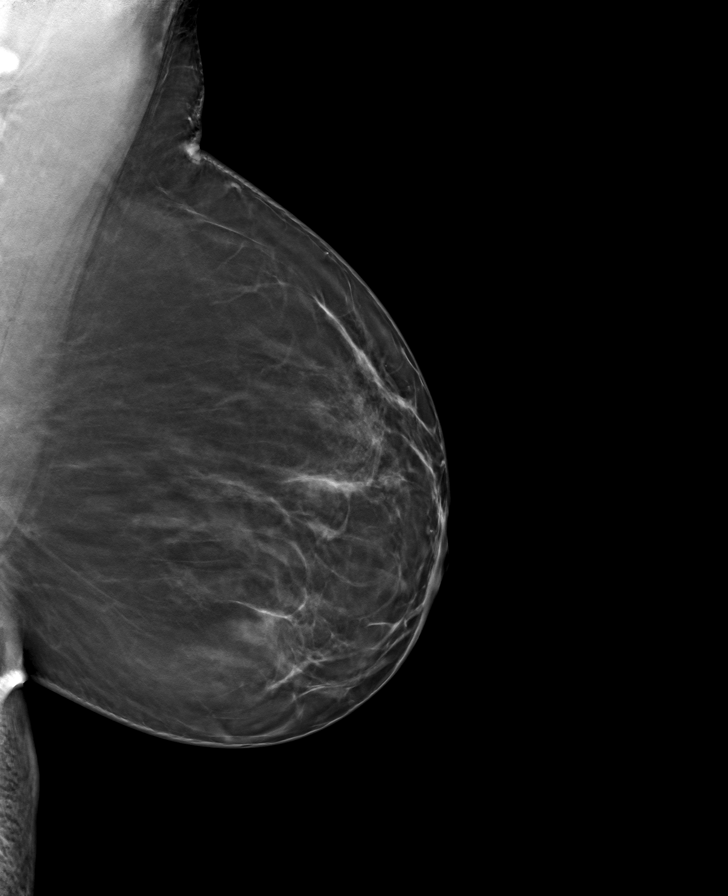

[L CC tomo · tomo slice 34/67.0]
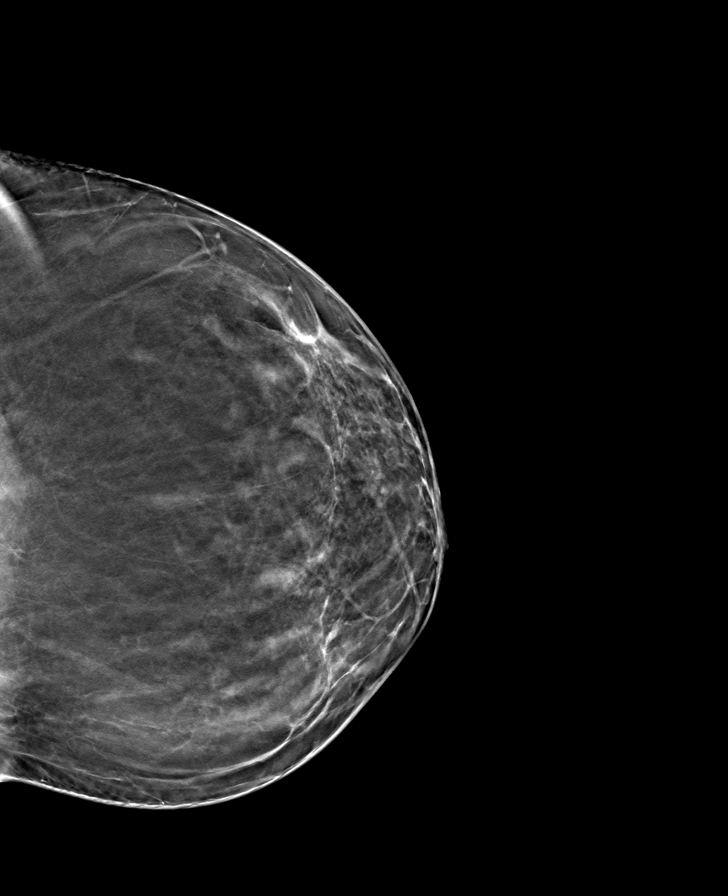

[R CC tomo · tomo slice 35/69.0]
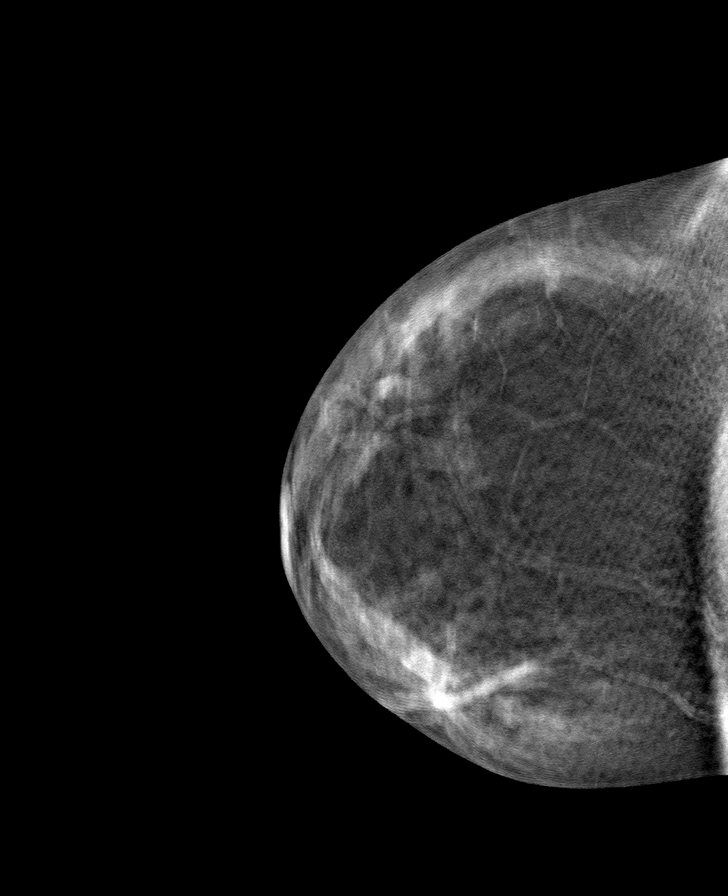

[R MLO tomo · tomo slice 37/74.0]
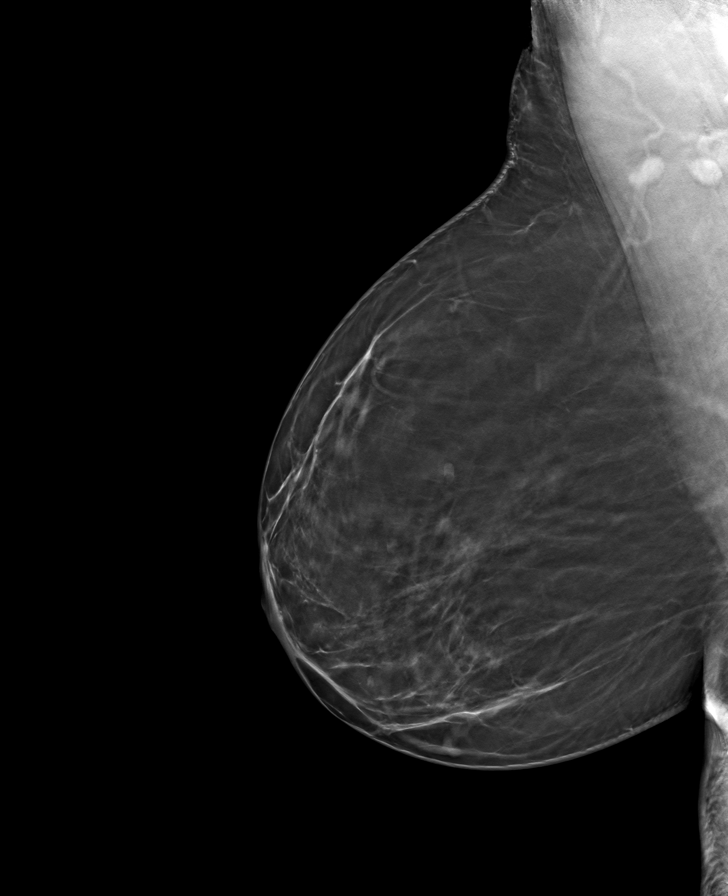

[8 of 24 positions shown; findings below may reference images not displayed]

ACR Breast Density Category b: There are scattered areas of
fibroglandular density.
FINDINGS: There are no findings suspicious for malignancy. The images were
evaluated with computer-aided detection.
IMPRESSION: No mammographic evidence of malignancy. A result letter of this
screening mammogram will be mailed directly to the patient.

RECOMMENDATION:
Screening mammogram in one year. (Code:WJ-I-BG6)

BI-RADS CATEGORY  1: Negative.

## 2022-03-13 ENCOUNTER — Other Ambulatory Visit: Payer: Self-pay | Admitting: Internal Medicine

## 2022-03-13 DIAGNOSIS — Z1231 Encounter for screening mammogram for malignant neoplasm of breast: Secondary | ICD-10-CM

## 2022-04-26 ENCOUNTER — Ambulatory Visit
Admission: RE | Admit: 2022-04-26 | Discharge: 2022-04-26 | Disposition: A | Discharge status: Home / Self Care | Payer: BC Managed Care – PPO | Source: Ambulatory Visit | Attending: Internal Medicine | Admitting: Internal Medicine

## 2022-04-26 ENCOUNTER — Encounter: Payer: Self-pay | Admitting: Radiology

## 2022-04-26 DIAGNOSIS — Z1231 Encounter for screening mammogram for malignant neoplasm of breast: Secondary | ICD-10-CM

## 2022-05-08 ENCOUNTER — Other Ambulatory Visit (HOSPITAL_COMMUNITY): Payer: Self-pay

## 2022-10-24 ENCOUNTER — Other Ambulatory Visit: Payer: Self-pay | Admitting: Gynecologic Oncology

## 2022-10-24 ENCOUNTER — Other Ambulatory Visit (HOSPITAL_COMMUNITY): Payer: Self-pay

## 2022-10-24 DIAGNOSIS — N951 Menopausal and female climacteric states: Secondary | ICD-10-CM

## 2022-10-24 MED ORDER — ESTRADIOL 0.1 MG/GM VA CREA
1.0000 | TOPICAL_CREAM | VAGINAL | 12 refills | Status: DC
  Filled 2022-10-24: qty 42.5, 90d supply, fill #0
  Filled 2022-12-07 (×2): qty 42.5, 25d supply, fill #0

## 2022-10-24 NOTE — Progress Notes (Signed)
Pt called requesting refill on estrace.

## 2022-11-01 ENCOUNTER — Other Ambulatory Visit (HOSPITAL_COMMUNITY): Payer: Self-pay

## 2022-12-07 ENCOUNTER — Other Ambulatory Visit (HOSPITAL_COMMUNITY): Payer: Self-pay

## 2022-12-13 ENCOUNTER — Other Ambulatory Visit (HOSPITAL_COMMUNITY): Payer: Self-pay

## 2023-02-11 ENCOUNTER — Other Ambulatory Visit: Payer: Self-pay | Admitting: Gynecologic Oncology

## 2023-02-11 ENCOUNTER — Other Ambulatory Visit (HOSPITAL_COMMUNITY): Payer: Self-pay

## 2023-02-11 DIAGNOSIS — F419 Anxiety disorder, unspecified: Secondary | ICD-10-CM

## 2023-02-11 MED ORDER — ESCITALOPRAM OXALATE 10 MG PO TABS
ORAL_TABLET | Freq: Every day | ORAL | 6 refills | Status: DC
  Filled 2023-02-11: qty 90, 90d supply, fill #0
  Filled 2023-06-04: qty 90, 90d supply, fill #1
  Filled 2023-11-11: qty 90, 90d supply, fill #2

## 2023-02-11 NOTE — Progress Notes (Signed)
Pt contacted the office requesting refill on lexapro. She is tolerating this well.

## 2023-02-12 ENCOUNTER — Other Ambulatory Visit (HOSPITAL_COMMUNITY): Payer: Self-pay

## 2023-02-28 ENCOUNTER — Other Ambulatory Visit (HOSPITAL_COMMUNITY): Payer: Self-pay

## 2023-02-28 MED ORDER — BENAZEPRIL-HYDROCHLOROTHIAZIDE 20-12.5 MG PO TABS
1.0000 | ORAL_TABLET | Freq: Every day | ORAL | 3 refills | Status: DC
  Filled 2023-02-28: qty 90, 90d supply, fill #0
  Filled 2023-06-21: qty 90, 90d supply, fill #1
  Filled 2023-12-23: qty 90, 90d supply, fill #2

## 2023-03-01 ENCOUNTER — Other Ambulatory Visit (HOSPITAL_COMMUNITY): Payer: Self-pay

## 2023-06-04 ENCOUNTER — Other Ambulatory Visit (HOSPITAL_COMMUNITY): Payer: Self-pay

## 2023-06-21 ENCOUNTER — Other Ambulatory Visit (HOSPITAL_COMMUNITY): Payer: Self-pay

## 2023-07-18 ENCOUNTER — Other Ambulatory Visit (HOSPITAL_COMMUNITY): Payer: Self-pay

## 2023-07-18 MED ORDER — ESCITALOPRAM OXALATE 10 MG PO TABS
10.0000 mg | ORAL_TABLET | Freq: Every day | ORAL | 3 refills | Status: DC
  Filled 2023-07-18: qty 90, 90d supply, fill #0

## 2023-09-17 ENCOUNTER — Other Ambulatory Visit: Payer: Self-pay | Admitting: Internal Medicine

## 2023-09-17 DIAGNOSIS — Z1231 Encounter for screening mammogram for malignant neoplasm of breast: Secondary | ICD-10-CM

## 2023-09-26 ENCOUNTER — Ambulatory Visit: Payer: Self-pay

## 2023-10-03 ENCOUNTER — Ambulatory Visit
Admission: RE | Admit: 2023-10-03 | Discharge: 2023-10-03 | Disposition: A | Discharge status: Home / Self Care | Payer: 59 | Source: Ambulatory Visit | Attending: Internal Medicine | Admitting: Internal Medicine

## 2023-10-03 DIAGNOSIS — Z1231 Encounter for screening mammogram for malignant neoplasm of breast: Secondary | ICD-10-CM

## 2023-11-11 ENCOUNTER — Other Ambulatory Visit (HOSPITAL_COMMUNITY): Payer: Self-pay

## 2023-12-23 ENCOUNTER — Other Ambulatory Visit (HOSPITAL_COMMUNITY): Payer: Self-pay

## 2024-02-18 ENCOUNTER — Other Ambulatory Visit (HOSPITAL_COMMUNITY): Payer: Self-pay

## 2024-02-18 MED ORDER — CYANOCOBALAMIN 1000 MCG/ML IJ SOLN
1000.0000 ug | INTRAMUSCULAR | 0 refills | Status: DC
  Filled 2024-02-18: qty 4, 28d supply, fill #0

## 2024-02-20 ENCOUNTER — Other Ambulatory Visit (HOSPITAL_COMMUNITY): Payer: Self-pay

## 2024-02-20 MED ORDER — CYANOCOBALAMIN 1000 MCG/ML IJ SOLN
1000.0000 ug | INTRAMUSCULAR | 3 refills | Status: DC
  Filled 2024-02-24 – 2024-03-10 (×2): qty 3, 90d supply, fill #0

## 2024-02-23 ENCOUNTER — Other Ambulatory Visit (HOSPITAL_COMMUNITY): Payer: Self-pay

## 2024-02-24 ENCOUNTER — Other Ambulatory Visit (HOSPITAL_COMMUNITY): Payer: Self-pay

## 2024-02-24 ENCOUNTER — Inpatient Hospital Stay: Attending: Gynecologic Oncology

## 2024-02-24 ENCOUNTER — Other Ambulatory Visit: Payer: Self-pay | Admitting: Gynecologic Oncology

## 2024-02-24 DIAGNOSIS — R197 Diarrhea, unspecified: Secondary | ICD-10-CM

## 2024-02-24 DIAGNOSIS — E538 Deficiency of other specified B group vitamins: Secondary | ICD-10-CM

## 2024-02-24 DIAGNOSIS — R2990 Unspecified symptoms and signs involving the nervous system: Secondary | ICD-10-CM

## 2024-02-24 DIAGNOSIS — Z86718 Personal history of other venous thrombosis and embolism: Secondary | ICD-10-CM | POA: Insufficient documentation

## 2024-02-24 DIAGNOSIS — D509 Iron deficiency anemia, unspecified: Secondary | ICD-10-CM | POA: Insufficient documentation

## 2024-02-24 LAB — BASIC METABOLIC PANEL WITH GFR
Anion gap: 6 (ref 5–15)
BUN: 14 mg/dL (ref 6–20)
CO2: 31 mmol/L (ref 22–32)
Calcium: 9.9 mg/dL (ref 8.9–10.3)
Chloride: 102 mmol/L (ref 98–111)
Creatinine, Ser: 0.82 mg/dL (ref 0.44–1.00)
GFR, Estimated: >60 mL/min (ref >60)
Glucose, Bld: 93 mg/dL (ref 70–99)
Potassium: 4.3 mmol/L (ref 3.5–5.1)
Sodium: 139 mmol/L (ref 135–145)

## 2024-02-24 LAB — MAGNESIUM: Magnesium: 2.3 mg/dL (ref 1.7–2.4)

## 2024-02-24 NOTE — Progress Notes (Signed)
 Patient presented to the office with neurological symptoms. Feels like she is in a tunnel. Having numbness in hand and toes. Had recent B12 level that was low. Has been taking B12 injections since last week. Plan to check Bmet and Mag level. Has been having frequent diarrhea since starting injections.

## 2024-03-03 ENCOUNTER — Other Ambulatory Visit (HOSPITAL_COMMUNITY): Payer: Self-pay

## 2024-03-10 ENCOUNTER — Other Ambulatory Visit: Payer: Self-pay

## 2024-03-10 ENCOUNTER — Other Ambulatory Visit (HOSPITAL_COMMUNITY): Payer: Self-pay

## 2024-04-15 ENCOUNTER — Other Ambulatory Visit (HOSPITAL_COMMUNITY): Payer: Self-pay

## 2024-04-15 ENCOUNTER — Other Ambulatory Visit: Payer: Self-pay | Admitting: Gynecologic Oncology

## 2024-04-15 DIAGNOSIS — N951 Menopausal and female climacteric states: Secondary | ICD-10-CM

## 2024-04-15 MED ORDER — ESTRADIOL 0.1 MG/GM VA CREA
1.0000 | TOPICAL_CREAM | VAGINAL | 12 refills | Status: AC
  Filled 2024-04-15: qty 42.5, 25d supply, fill #0

## 2024-04-17 ENCOUNTER — Other Ambulatory Visit (HOSPITAL_COMMUNITY): Payer: Self-pay

## 2024-05-11 ENCOUNTER — Other Ambulatory Visit (HOSPITAL_COMMUNITY): Payer: Self-pay

## 2024-05-11 ENCOUNTER — Other Ambulatory Visit: Payer: Self-pay | Admitting: Gynecologic Oncology

## 2024-05-11 DIAGNOSIS — F419 Anxiety disorder, unspecified: Secondary | ICD-10-CM

## 2024-05-11 MED ORDER — ESCITALOPRAM OXALATE 10 MG PO TABS
ORAL_TABLET | Freq: Every day | ORAL | 6 refills | Status: AC
  Filled 2024-05-11: qty 90, 90d supply, fill #0

## 2024-05-12 ENCOUNTER — Other Ambulatory Visit (HOSPITAL_COMMUNITY): Payer: Self-pay

## 2024-05-25 ENCOUNTER — Other Ambulatory Visit: Payer: Self-pay | Admitting: Gynecologic Oncology

## 2024-05-25 ENCOUNTER — Other Ambulatory Visit (HOSPITAL_COMMUNITY): Payer: Self-pay

## 2024-05-25 DIAGNOSIS — I10 Essential (primary) hypertension: Secondary | ICD-10-CM

## 2024-05-25 MED ORDER — BENAZEPRIL-HYDROCHLOROTHIAZIDE 20-12.5 MG PO TABS
1.0000 | ORAL_TABLET | Freq: Every day | ORAL | 3 refills | Status: AC
  Filled 2024-05-25: qty 90, 90d supply, fill #0

## 2024-05-28 ENCOUNTER — Other Ambulatory Visit (HOSPITAL_COMMUNITY): Payer: Self-pay

## 2024-06-08 ENCOUNTER — Other Ambulatory Visit (HOSPITAL_COMMUNITY): Payer: Self-pay

## 2024-06-08 ENCOUNTER — Other Ambulatory Visit: Payer: Self-pay | Admitting: Gynecologic Oncology

## 2024-06-08 DIAGNOSIS — E538 Deficiency of other specified B group vitamins: Secondary | ICD-10-CM

## 2024-06-08 MED ORDER — CYANOCOBALAMIN 1000 MCG/ML IJ SOLN
1000.0000 ug | INTRAMUSCULAR | 3 refills | Status: AC
  Filled 2024-06-08: qty 3, 90d supply, fill #0
  Filled 2024-08-11 – 2024-09-07 (×3): qty 3, 90d supply, fill #1

## 2024-06-08 NOTE — Progress Notes (Signed)
 Patient contacted the office. Recent labs at [facility] positive for intrinsic factor. Plan for once monthly injections of 1000 mcg B12 for maintenance.

## 2024-08-10 ENCOUNTER — Other Ambulatory Visit (HOSPITAL_COMMUNITY): Payer: Self-pay

## 2024-08-10 MED ORDER — AMOXICILLIN 500 MG PO CAPS
500.0000 mg | ORAL_CAPSULE | Freq: Three times a day (TID) | ORAL | 1 refills | Status: AC
  Filled 2024-08-10: qty 21, 7d supply, fill #0

## 2024-08-11 ENCOUNTER — Other Ambulatory Visit (HOSPITAL_COMMUNITY): Payer: Self-pay

## 2024-09-07 ENCOUNTER — Other Ambulatory Visit (HOSPITAL_COMMUNITY): Payer: Self-pay
